# Patient Record
Sex: Male | Born: 1985 | Race: Black or African American | Hispanic: No | Marital: Single | Smoking: Current every day smoker
Health system: Southern US, Community
[De-identification: ages and names within clinical notes are randomized; demographics above are authoritative.]

## PROBLEM LIST (undated history)

## (undated) DIAGNOSIS — F329 Major depressive disorder, single episode, unspecified: Secondary | ICD-10-CM

## (undated) DIAGNOSIS — F32A Depression, unspecified: Secondary | ICD-10-CM

## (undated) DIAGNOSIS — F319 Bipolar disorder, unspecified: Secondary | ICD-10-CM

## (undated) DIAGNOSIS — F39 Unspecified mood [affective] disorder: Secondary | ICD-10-CM

## (undated) DIAGNOSIS — M1990 Unspecified osteoarthritis, unspecified site: Secondary | ICD-10-CM

## (undated) DIAGNOSIS — K589 Irritable bowel syndrome without diarrhea: Secondary | ICD-10-CM

## (undated) DIAGNOSIS — F419 Anxiety disorder, unspecified: Secondary | ICD-10-CM

## (undated) DIAGNOSIS — K5090 Crohn's disease, unspecified, without complications: Secondary | ICD-10-CM

## (undated) DIAGNOSIS — N200 Calculus of kidney: Secondary | ICD-10-CM

## (undated) DIAGNOSIS — F1910 Other psychoactive substance abuse, uncomplicated: Secondary | ICD-10-CM

## (undated) DIAGNOSIS — B009 Herpesviral infection, unspecified: Secondary | ICD-10-CM

## (undated) DIAGNOSIS — N451 Epididymitis: Secondary | ICD-10-CM

---

## 2006-05-31 DIAGNOSIS — R4589 Other symptoms and signs involving emotional state: Secondary | ICD-10-CM | POA: Insufficient documentation

## 2011-08-04 DIAGNOSIS — K589 Irritable bowel syndrome without diarrhea: Secondary | ICD-10-CM | POA: Insufficient documentation

## 2012-06-04 DIAGNOSIS — A6000 Herpesviral infection of urogenital system, unspecified: Secondary | ICD-10-CM | POA: Insufficient documentation

## 2012-08-03 DIAGNOSIS — N451 Epididymitis: Secondary | ICD-10-CM | POA: Insufficient documentation

## 2012-08-03 DIAGNOSIS — E559 Vitamin D deficiency, unspecified: Secondary | ICD-10-CM | POA: Insufficient documentation

## 2012-12-20 DIAGNOSIS — F411 Generalized anxiety disorder: Secondary | ICD-10-CM | POA: Insufficient documentation

## 2012-12-20 DIAGNOSIS — F319 Bipolar disorder, unspecified: Secondary | ICD-10-CM | POA: Insufficient documentation

## 2020-09-01 ENCOUNTER — Emergency Department
Admission: EM | Admit: 2020-09-01 | Discharge: 2020-09-01 | Disposition: A | Discharge status: Home / Self Care | Payer: Self-pay | Attending: Emergency Medicine | Admitting: Emergency Medicine

## 2020-09-01 ENCOUNTER — Emergency Department: Payer: Self-pay

## 2020-09-01 ENCOUNTER — Encounter: Payer: Self-pay | Admitting: Emergency Medicine

## 2020-09-01 ENCOUNTER — Other Ambulatory Visit: Payer: Self-pay

## 2020-09-01 DIAGNOSIS — F17210 Nicotine dependence, cigarettes, uncomplicated: Secondary | ICD-10-CM | POA: Insufficient documentation

## 2020-09-01 DIAGNOSIS — K529 Noninfective gastroenteritis and colitis, unspecified: Secondary | ICD-10-CM

## 2020-09-01 DIAGNOSIS — K5010 Crohn's disease of large intestine without complications: Secondary | ICD-10-CM

## 2020-09-01 DIAGNOSIS — R1030 Lower abdominal pain, unspecified: Secondary | ICD-10-CM | POA: Insufficient documentation

## 2020-09-01 HISTORY — DX: Unspecified osteoarthritis, unspecified site: M19.90

## 2020-09-01 HISTORY — DX: Crohn's disease, unspecified, without complications: K50.90

## 2020-09-01 HISTORY — DX: Herpesviral infection, unspecified: B00.9

## 2020-09-01 LAB — URINALYSIS, COMPLETE (UACMP) WITH MICROSCOPIC
Bilirubin Urine: NEGATIVE
Glucose, UA: NEGATIVE mg/dL
Hgb urine dipstick: NEGATIVE
Ketones, ur: NEGATIVE mg/dL
Leukocytes,Ua: NEGATIVE
Nitrite: NEGATIVE
Protein, ur: 30 mg/dL — AB
Specific Gravity, Urine: 1.005 (ref 1.005–1.030)
pH: 7 (ref 5.0–8.0)

## 2020-09-01 LAB — COMPREHENSIVE METABOLIC PANEL
ALT: 11 U/L (ref 0–44)
AST: 18 U/L (ref 15–41)
Albumin: 4.9 g/dL (ref 3.5–5.0)
Alkaline Phosphatase: 57 U/L (ref 38–126)
Anion gap: 8 (ref 5–15)
BUN: 6 mg/dL (ref 6–20)
CO2: 28 mmol/L (ref 22–32)
Calcium: 9.6 mg/dL (ref 8.9–10.3)
Chloride: 102 mmol/L (ref 98–111)
Creatinine, Ser: 0.79 mg/dL (ref 0.61–1.24)
GFR, Estimated: >60 mL/min (ref >60)
Glucose, Bld: 126 mg/dL — ABNORMAL HIGH (ref 70–99)
Potassium: 4.3 mmol/L (ref 3.5–5.1)
Sodium: 138 mmol/L (ref 135–145)
Total Bilirubin: 1.4 mg/dL — ABNORMAL HIGH (ref 0.3–1.2)
Total Protein: 8 g/dL (ref 6.5–8.1)

## 2020-09-01 LAB — CBC
Hemoglobin: 16.2 g/dL (ref 13.0–17.0)
Platelets: 139 10*3/uL — ABNORMAL LOW (ref 150–400)
WBC: 5.5 10*3/uL (ref 4.0–10.5)

## 2020-09-01 LAB — LIPASE, BLOOD: Lipase: 33 U/L (ref 11–51)

## 2020-09-01 MED ORDER — DICYCLOMINE HCL 10 MG PO CAPS: 10.0000 mg | ORAL_CAPSULE | Freq: Three times a day (TID) | ORAL | 0 refills | Status: DC | PRN

## 2020-09-01 MED ORDER — FOSFOMYCIN TROMETHAMINE 3 G PO PACK
3.0000 g | PACK | Freq: Once | ORAL | Status: AC
  Administered 2020-09-01: 3 g via ORAL
  Filled 2020-09-01: qty 3

## 2020-09-01 MED ORDER — METRONIDAZOLE 500 MG PO TABS: 500.0000 mg | ORAL_TABLET | Freq: Three times a day (TID) | ORAL | 0 refills | Status: AC

## 2020-09-01 MED ORDER — ONDANSETRON 4 MG PO TBDP: 4.0000 mg | ORAL_TABLET | Freq: Three times a day (TID) | ORAL | 0 refills | Status: DC | PRN

## 2020-09-01 MED ORDER — CIPROFLOXACIN HCL 500 MG PO TABS: 500.0000 mg | ORAL_TABLET | Freq: Two times a day (BID) | ORAL | 0 refills | Status: AC

## 2020-09-01 MED ORDER — SODIUM CHLORIDE 0.9 % IV BOLUS
1000.0000 mL | Freq: Once | INTRAVENOUS | Status: AC
  Administered 2020-09-01: 1000 mL via INTRAVENOUS

## 2020-09-01 MED ORDER — MORPHINE SULFATE (PF) 4 MG/ML IV SOLN
4.0000 mg | Freq: Once | INTRAVENOUS | Status: AC
  Administered 2020-09-01: 4 mg via INTRAVENOUS
  Filled 2020-09-01: qty 1

## 2020-09-01 MED ORDER — ONDANSETRON HCL 4 MG/2ML IJ SOLN
4.0000 mg | Freq: Once | INTRAMUSCULAR | Status: AC
  Administered 2020-09-01: 4 mg via INTRAVENOUS
  Filled 2020-09-01: qty 2

## 2020-09-01 MED ORDER — IOHEXOL 300 MG/ML  SOLN
100.0000 mL | Freq: Once | INTRAMUSCULAR | Status: AC | PRN
  Administered 2020-09-01: 100 mL via INTRAVENOUS

## 2020-09-01 NOTE — ED Notes (Signed)
E-signature not working at this time. Pt verbalized understanding of D/C instructions, prescriptions and follow up care with no further questions at this time. Pt in NAD and ambulatory at time of D/C.  

## 2020-09-01 NOTE — ED Notes (Signed)
Pt to CT

## 2020-09-01 NOTE — ED Notes (Signed)
Pt aware of needed repeat urine sample

## 2020-09-01 NOTE — ED Triage Notes (Signed)
Pt in w/lower abdominal pain x 53mo, hx of Crohn's. States he has been trying to manage w/just diet to this point, no meds. Reports bloody stools/diarrhea and weakness. Recently moved from [place], all records at [facility]

## 2020-09-01 NOTE — ED Provider Notes (Signed)
[facility] Emergency Department Provider Note  Time seen: 2:31 PM  I have reviewed the triage vital signs and the nursing notes.   HISTORY  Chief Complaint Abdominal Pain   HPI Sean Gallegos is a 34 y.o. male with a past medical history of Crohn's disease, presents emergency department for lower abdominal pain.  According to the patient for the past month or so he has been experiencing intermittent bloody stools as well as lower abdominal pain.  Patient states he was diagnosed with Crohn's disease approximately 1 year ago and his care has been at [facility] in [place], but the patient recently moved to this area.  Has not yet established care with a GI physician.  Patient has no known fevers, cough congestion shortness of breath or chest pain.  Describes his abdominal pain as moderate aching dull pain that has been constant for the past 1 month.   Past Medical History:  Diagnosis Date  . Arthritis   . Crohn disease (HCC)   . Herpes simplex type 2 infection     There are no problems to display for this patient.   History reviewed. No pertinent surgical history.  Prior to Admission medications   Not on File    Not on File  No family history on file.  Social History Social History   Tobacco Use  . Smoking status: Current Every Day Smoker    Packs/day: 1.00    Types: Cigarettes  . Smokeless tobacco: Never Used  Substance Use Topics  . Alcohol use: Not Currently  . Drug use: Not Currently    Types: Marijuana    Review of Systems Constitutional: Negative for fever Cardiovascular: Negative for chest pain. Respiratory: Negative for shortness of breath. Gastrointestinal: Intermittent lower abdominal pain.  Intermittent bloody stools.  States occasional nausea vomiting. Genitourinary: Negative for urinary compaints Musculoskeletal: Negative for musculoskeletal complaints Skin: Negative for skin complaints  Neurological: Negative for  headache All other ROS negative  ____________________________________________   PHYSICAL EXAM:  VITAL SIGNS: ED Triage Vitals  Enc Vitals Group     BP 09/01/20 1156 133/84     Pulse Rate 09/01/20 1156 78     Resp 09/01/20 1156 18     Temp 09/01/20 1156 98.1 F (36.7 C)     Temp Source 09/01/20 1156 Oral     SpO2 09/01/20 1156 99 %     Weight 09/01/20 1157 130 lb (59 kg)     Height --      Head Circumference --      Peak Flow --      Pain Score 09/01/20 1156 10     Pain Loc --      Pain Edu? --      Excl. in GC? --     Constitutional: Alert and oriented. Well appearing and in no distress. Eyes: Normal exam ENT      Head: Normocephalic and atraumatic.      Mouth/Throat: Mucous membranes are moist. Cardiovascular: Normal rate, regular rhythm.  Respiratory: Normal respiratory effort without tachypnea nor retractions. Breath sounds are clear  Gastrointestinal: Soft, mild diffuse tenderness palpation.  No rebound guarding or distention. Musculoskeletal: Nontender with normal range of motion in all extremities.  Neurologic:  Normal speech and language. No gross focal neurologic deficits Skin:  Skin is warm, dry and intact.  Psychiatric: Mood and affect are normal.   ____________________________________________   RADIOLOGY  CT pending  ____________________________________________   INITIAL IMPRESSION / ASSESSMENT AND PLAN /  ED COURSE  Pertinent labs & imaging results that were available during my care of the patient were reviewed by me and considered in my medical decision making (see chart for details).   Patient presents to the emergency department for lower abdominal discomfort intermittent bloody stools over the past 1 month along with intermittent nausea vomiting.  Patient states a history of Crohn's disease, states this feels like his typical Crohn's flare.  Patient has not yet established GI care in the area.  Patient's lab work does show white cells within his  urinalysis I have added on a GC chlamydia we will dose a one-time dose of fosfomycin in the emergency department and send a urine culture, this could also be reactive if the patient is experiencing colonic inflammation.  Remainder of the work-up is nonrevealing.  We will proceed with CT imaging of the abdomen/pelvis to further evaluate.  We will treat pain nausea and IV hydrate while awaiting CT results.  Patient agreeable to plan of care.  Patient care signed out to oncoming physician CT pending.  Sean Gallegos was evaluated in Emergency Department on 09/01/2020 for the symptoms described in the history of present illness. He was evaluated in the context of the global COVID-19 pandemic, which necessitated consideration that the patient might be at risk for infection with the SARS-CoV-2 virus that causes COVID-19. Institutional protocols and algorithms that pertain to the evaluation of patients at risk for COVID-19 are in a state of rapid change based on information released by regulatory bodies including the CDC and federal and state organizations. These policies and algorithms were followed during the patient's care in the ED.  ____________________________________________   FINAL CLINICAL IMPRESSION(S) / ED DIAGNOSES  Abdominal pain   Minna Antis, MD 09/01/20 1433

## 2020-09-01 NOTE — ED Provider Notes (Signed)
Patient is a 34 yo M with h/o Crohn's (diagnosed in [place], does not have GI physician here yet) here with abd pain. Labs show normal WBC, Hgb. CMP unremarkable, lipase normal. UA with mild pyuria, hematuria likely reactive 2/2 colitis based on CT A/P. No complications. Will place referral for GI, d/c with ABX, outpt follow-up.   Shaune Pollack, MD 09/01/20 [phone]

## 2020-09-03 LAB — URINE CULTURE: Culture: NO GROWTH

## 2020-09-08 ENCOUNTER — Other Ambulatory Visit: Payer: Self-pay

## 2020-09-09 ENCOUNTER — Other Ambulatory Visit: Payer: Self-pay

## 2020-09-09 ENCOUNTER — Encounter: Payer: Self-pay | Admitting: Gastroenterology

## 2020-09-09 ENCOUNTER — Ambulatory Visit (INDEPENDENT_AMBULATORY_CARE_PROVIDER_SITE_OTHER): Payer: Self-pay | Admitting: Gastroenterology

## 2020-09-09 VITALS — BP 105/72 | HR 78 | Temp 98.3°F | Ht 73.0 in | Wt 130.1 lb

## 2020-09-09 DIAGNOSIS — K50111 Crohn's disease of large intestine with rectal bleeding: Secondary | ICD-10-CM

## 2020-09-09 DIAGNOSIS — K51511 Left sided colitis with rectal bleeding: Secondary | ICD-10-CM

## 2020-09-09 DIAGNOSIS — K50819 Crohn's disease of both small and large intestine with unspecified complications: Secondary | ICD-10-CM

## 2020-09-09 MED ORDER — PREDNISONE 10 MG PO TABS: 40.0000 mg | ORAL_TABLET | Freq: Every day | ORAL | 0 refills | Status: DC

## 2020-09-09 NOTE — Progress Notes (Signed)
Arlyss Repress, MD [address]  [address]  [place], [place] [address]  Main: [phone]  Fax: [phone]    Gastroenterology Consultation  Referring Provider:     Shaune Pollack, MD Primary Care Physician:  Patient, No Pcp Per Primary Gastroenterologist:  Dr. Arlyss Repress Reason for Consultation:     History of Crohn's        HPI:   [place] Nordquist is a 34 y.o. male referred by Dr. Patient, No Pcp Per  for consultation & management of history of Crohn's.  Patient reports that he has been diagnosed with Crohn's disease at [facility], [place].  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  He reports that he has been having GI symptoms for several years.  He was recently at [facility] ER secondary to lower abdominal pain, fatigue, chills and diarrhea.  He also reports loss of appetite and weight loss.  Labs in the ER revealed mild thrombocytopenia, mild elevated T bilirubin, no leukocytosis, mildly elevated lipase.  He underwent CT abdomen pelvis with contrast which revealed mild thickening of the distal colon, rectosigmoid with increased vascularity.  He was discharged home on Cipro and Flagyl as well as Bentyl.  Patient continues to have ongoing symptoms, worsening, 4-5 watery bowel movements daily associated with ongoing lower abdominal pain  Patient does not have insurance and would like to apply for patient assistance program He does smoke more than 1 pack/day, also smokes marijuana  NSAIDs: None  Antiplts/Anticoagulants/Anti thrombotics: None  GI Procedures: None  Past Medical History:  Diagnosis Date  . Arthritis   . Crohn disease (HCC)   . Herpes simplex type 2 infection     History reviewed. No pertinent surgical history.  Current Outpatient Medications:  .  acyclovir (ZOVIRAX) 800 MG tablet, Take by mouth., Disp: , Rfl:  .  dicyclomine (BENTYL) 10 MG capsule, Take 1 capsule (10 mg total) by mouth 3  (three) times daily as needed for up to 7 days for spasms (abdominal pain/cramping)., Disp: 21 capsule, Rfl: 0 .  ondansetron (ZOFRAN ODT) 4 MG disintegrating tablet, Take 1 tablet (4 mg total) by mouth every 8 (eight) hours as needed for nausea or vomiting., Disp: 12 tablet, Rfl: 0 .  pantoprazole (PROTONIX) 40 MG tablet, Take 1 tablet by mouth daily., Disp: , Rfl:  .  PARoxetine (PAXIL) 20 MG tablet, Take by mouth., Disp: , Rfl:  .  predniSONE (DELTASONE) 10 MG tablet, Take 4 tablets (40 mg total) by mouth daily with breakfast for 14 days., Disp: 56 tablet, Rfl: 0   History reviewed. No pertinent family history.   Social History   Tobacco Use  . Smoking status: Current Every Day Smoker    Packs/day: 1.00    Types: Cigarettes  . Smokeless tobacco: Never Used  Substance Use Topics  . Alcohol use: Not Currently  . Drug use: Not Currently    Types: Marijuana    Allergies as of 09/09/2020 - Review Complete 09/09/2020  Allergen Reaction Noted  . Gluten meal Other (See Comments) 08/30/2012  . Other Itching 07/01/2016    Review of Systems:    All systems reviewed and negative except where noted in HPI.   Physical Exam:  BP 105/72 (BP Location: Left Arm, Patient Position: Sitting, Cuff Size: Normal)   Pulse 78   Temp 98.3 F (36.8 C) (Oral)   Ht 6\' 1"  (1.854 m)   Wt 130 lb 2  oz (59 kg)   BMI 17.17 kg/m  No LMP for male patient.  General:   Alert, thin built, poorly nourished, pleasant and cooperative in NAD Head:  Normocephalic and atraumatic. Eyes:  Sclera clear, no icterus.   Conjunctiva pink. Ears:  Normal auditory acuity. Nose:  No deformity, discharge, or lesions. Mouth:  No deformity or lesions,oropharynx pink & moist. Neck:  Supple; no masses or thyromegaly. Lungs:  Respirations even and unlabored.  Clear throughout to auscultation.   No wheezes, crackles, or rhonchi. No acute distress. Heart:  Regular rate and rhythm; no murmurs, clicks, rubs, or gallops. Abdomen:   Normal bowel sounds. Soft, lower abdominal tenderness and non-distended without masses, hepatosplenomegaly or hernias noted.  No guarding or rebound tenderness.   Rectal: Not performed Msk:  Symmetrical without gross deformities. Good, equal movement & strength bilaterally. Pulses:  Normal pulses noted. Extremities:  No clubbing or edema.  No cyanosis. Neurologic:  Alert and oriented x3;  grossly normal neurologically. Skin:  Intact without significant lesions or rashes. No jaundice. Lymph Nodes:  No significant cervical adenopathy. Psych:  Alert and cooperative. Normal mood and affect.  Imaging Studies: Reviewed  Assessment and Plan:   Ocie Tino is a 34 y.o. African-American male with history of Crohn's disease diagnosed in [place] clinic, chronic tobacco use is seen in consultation for progressively worsening lower abdominal pain associated with diarrhea, weight loss, fatigue.  Recent CT scan revealed left-sided colitis  Recommend stool studies to rule out infection Check fecal calprotectin levels Recommend colonoscopy with TI evaluation if stool studies are negative, to evaluate for Crohn's disease Labs today Trial of short course of prednisone 40 mg daily for 2 weeks given recent flareup of symptoms and underlying history of Crohn's disease  Follow up in 4 weeks   Arlyss Repress, MD

## 2020-09-09 NOTE — Patient Instructions (Signed)
Please find a Primary care physician before your next appointment with our office. [facility] Customer service manager can help you find one by calling [phone]

## 2020-09-14 ENCOUNTER — Other Ambulatory Visit: Payer: Self-pay

## 2020-09-14 ENCOUNTER — Encounter: Payer: Self-pay | Admitting: *Deleted

## 2020-09-14 DIAGNOSIS — Z79899 Other long term (current) drug therapy: Secondary | ICD-10-CM | POA: Insufficient documentation

## 2020-09-14 DIAGNOSIS — F17210 Nicotine dependence, cigarettes, uncomplicated: Secondary | ICD-10-CM | POA: Insufficient documentation

## 2020-09-14 DIAGNOSIS — R109 Unspecified abdominal pain: Secondary | ICD-10-CM | POA: Insufficient documentation

## 2020-09-14 DIAGNOSIS — F32A Depression, unspecified: Secondary | ICD-10-CM | POA: Insufficient documentation

## 2020-09-14 DIAGNOSIS — Z5900 Homelessness unspecified: Secondary | ICD-10-CM | POA: Insufficient documentation

## 2020-09-14 LAB — COMPREHENSIVE METABOLIC PANEL
ALT: 14 U/L (ref 0–44)
AST: 22 U/L (ref 15–41)
Albumin: 4.5 g/dL (ref 3.5–5.0)
Alkaline Phosphatase: 52 U/L (ref 38–126)
Anion gap: 10 (ref 5–15)
BUN: 7 mg/dL (ref 6–20)
CO2: 28 mmol/L (ref 22–32)
Calcium: 9.2 mg/dL (ref 8.9–10.3)
Chloride: 99 mmol/L (ref 98–111)
Creatinine, Ser: 0.92 mg/dL (ref 0.61–1.24)
GFR, Estimated: >60 mL/min (ref >60)
Glucose, Bld: 157 mg/dL — ABNORMAL HIGH (ref 70–99)
Potassium: 3.5 mmol/L (ref 3.5–5.1)
Sodium: 137 mmol/L (ref 135–145)
Total Bilirubin: 0.9 mg/dL (ref 0.3–1.2)
Total Protein: 7.4 g/dL (ref 6.5–8.1)

## 2020-09-14 LAB — LIPASE, BLOOD: Lipase: 33 U/L (ref 11–51)

## 2020-09-14 NOTE — ED Triage Notes (Signed)
First Nurse: patient states that he was seen here about a week ago for Chron's. Patient states that he has finished taking his medication for his Chron's and that he is continuing to have the same symptoms.

## 2020-09-14 NOTE — ED Triage Notes (Signed)
Pt reporting he feels as though his symptoms from a week ago have been getting wore. Pt reports continues abd pain, bilateral flank pain that is "tender" upon palpation, dysuria and testicular pain. All symptoms reportedly started over a week ago but have not gotten better after taking prescribed medications. Nausea without vomiting or diarrhea. No fevers.

## 2020-09-15 ENCOUNTER — Emergency Department
Admission: EM | Admit: 2020-09-15 | Discharge: 2020-09-15 | Disposition: A | Discharge status: Home / Self Care | Payer: Self-pay | Attending: Emergency Medicine | Admitting: Emergency Medicine

## 2020-09-15 DIAGNOSIS — G8929 Other chronic pain: Secondary | ICD-10-CM

## 2020-09-15 DIAGNOSIS — R45851 Suicidal ideations: Secondary | ICD-10-CM

## 2020-09-15 DIAGNOSIS — Z5900 Homelessness unspecified: Secondary | ICD-10-CM

## 2020-09-15 DIAGNOSIS — F32A Depression, unspecified: Secondary | ICD-10-CM

## 2020-09-15 DIAGNOSIS — K50111 Crohn's disease of large intestine with rectal bleeding: Secondary | ICD-10-CM

## 2020-09-15 LAB — URINALYSIS, COMPLETE (UACMP) WITH MICROSCOPIC
Bacteria, UA: NONE SEEN
Bilirubin Urine: NEGATIVE
Glucose, UA: NEGATIVE mg/dL
Hgb urine dipstick: NEGATIVE
Ketones, ur: NEGATIVE mg/dL
Leukocytes,Ua: NEGATIVE
Nitrite: NEGATIVE
Protein, ur: NEGATIVE mg/dL
Specific Gravity, Urine: 1.009 (ref 1.005–1.030)
Squamous Epithelial / HPF: NONE SEEN (ref 0–5)
pH: 6 (ref 5.0–8.0)

## 2020-09-15 LAB — CBC
Hemoglobin: 15.8 g/dL (ref 13.0–17.0)
Platelets: 161 10*3/uL (ref 150–400)
WBC: 6 10*3/uL (ref 4.0–10.5)
nRBC: 0 % (ref 0.0–0.2)

## 2020-09-15 MED ORDER — PREDNISONE 10 MG PO TABS: 40.0000 mg | ORAL_TABLET | Freq: Every day | ORAL | 0 refills | Status: AC

## 2020-09-15 MED ORDER — IBUPROFEN 600 MG PO TABS: 600.0000 mg | ORAL_TABLET | Freq: Four times a day (QID) | ORAL | Status: DC | PRN

## 2020-09-15 MED ORDER — OXYCODONE HCL 5 MG PO TABS
5.0000 mg | ORAL_TABLET | Freq: Once | ORAL | Status: AC
  Administered 2020-09-15: 5 mg via ORAL
  Filled 2020-09-15: qty 1

## 2020-09-15 MED ORDER — ACETAMINOPHEN 500 MG PO TABS
1000.0000 mg | ORAL_TABLET | Freq: Once | ORAL | Status: AC
  Administered 2020-09-15: 1000 mg via ORAL
  Filled 2020-09-15: qty 2

## 2020-09-15 MED ORDER — PREDNISONE 20 MG PO TABS: 40.0000 mg | ORAL_TABLET | Freq: Every day | ORAL | Status: DC

## 2020-09-15 MED ORDER — KETOROLAC TROMETHAMINE 30 MG/ML IJ SOLN
15.0000 mg | Freq: Once | INTRAMUSCULAR | Status: AC
  Administered 2020-09-15: 15 mg via INTRAVENOUS
  Filled 2020-09-15: qty 1

## 2020-09-15 MED ORDER — ONDANSETRON HCL 4 MG/2ML IJ SOLN
4.0000 mg | Freq: Once | INTRAMUSCULAR | Status: AC
  Administered 2020-09-15: 4 mg via INTRAVENOUS
  Filled 2020-09-15: qty 2

## 2020-09-15 MED ORDER — METHYLPREDNISOLONE SODIUM SUCC 125 MG IJ SOLR
125.0000 mg | Freq: Once | INTRAMUSCULAR | Status: AC
  Administered 2020-09-15: 125 mg via INTRAVENOUS
  Filled 2020-09-15: qty 2

## 2020-09-15 NOTE — ED Provider Notes (Signed)
Patient cleared by psychiatry after what sounds like an initial misunderstanding/confusion re: his suicidality. He denies any SI, HI, AVH. He is calm and cooperative. He was waiting for SW to assist with meds but is now requesting to leave. Given that he does not meet IVC criteria and I see no evidence of other acute emergent condition, will d/c. He was provided with a paper rx to take to med management.    Shaune Pollack, MD 09/15/20 [phone]

## 2020-09-15 NOTE — ED Provider Notes (Addendum)
[facility] Emergency Department Provider Note  ____________________________________________  Time seen: Approximately 1:03 AM  I have reviewed the triage vital signs and the nursing notes.   HISTORY  Chief Complaint Abdominal Pain and Testicle Pain   HPI Sean Gallegos is a 34 y.o. male who presents for evaluation of abdominal pain.  Patient reports that he has been dealing with chronic abdominal pain for over 20 years.  He reports that he received a diagnosis of Crohn's disease at the [facility] without any colonoscopy done.  He says that the doctors over there told him that he may have Crohn's or ulcerative colitis.  Unfortunately patient was unable to have access to definitive diagnosis or treatment.  He reports over the last 3 weeks he has been having pretty diffuse severe abdominal pain which is located in the lower part of his abdomen radiating down to his testicles and to bilateral flank.  Has been having 6-10 episodes of diarrhea which he noticed to be black in color and have mucus at times.  Has had pretty profound nausea and decreased appetite.  No vomiting.  No fevers but has had chills.  Also reports urinary frequency.  Denies any testicular swelling or penile discharge.  Patient was seen by GI 2 days ago to establish care and was recommended to be started on prednisone.  Unfortunately patient has been unable to afford.  Patient is also complaining of feeling extremely depressed.  He reports that he does not have any support system.  Lost all his money into the stock market and has been living in his car while dealing with chronic abdominal pain and diarrhea.  Patient reports having no family here, no job, being homeless.  He reports 1 prior episode of suicide attempt many years ago with overdose on narcotics.  At this time he feels suicidal but denies an active plan.  No access to guns.   Past Medical History:  Diagnosis Date  . Arthritis   . Crohn  disease (HCC)   . Herpes simplex type 2 infection     Patient Active Problem List   Diagnosis Date Noted  . Depression with suicidal ideation 09/15/2020  . Anxiety, generalized 12/20/2012  . Bipolar affective disorder (HCC) 12/20/2012  . Epididymitis 08/03/2012  . Vitamin D deficiency 08/03/2012  . Genital herpes simplex 06/04/2012  . Irritable bowel syndrome 08/04/2011  . Predominant disturbance of emotions 05/31/2006    History reviewed. No pertinent surgical history.  Prior to Admission medications   Medication Sig Start Date End Date Taking? Authorizing Provider  acyclovir (ZOVIRAX) 800 MG tablet Take by mouth.    [provider]  dicyclomine (BENTYL) 10 MG capsule Take 1 capsule (10 mg total) by mouth 3 (three) times daily as needed for up to 7 days for spasms (abdominal pain/cramping). 09/01/20 09/08/20  Shaune Pollack, MD  ondansetron (ZOFRAN ODT) 4 MG disintegrating tablet Take 1 tablet (4 mg total) by mouth every 8 (eight) hours as needed for nausea or vomiting. 09/01/20   Shaune Pollack, MD  pantoprazole (PROTONIX) 40 MG tablet Take 1 tablet by mouth daily. 11/05/19   [provider]  PARoxetine (PAXIL) 20 MG tablet Take by mouth. 08/15/15   [provider]  predniSONE (DELTASONE) 10 MG tablet Take 4 tablets (40 mg total) by mouth daily with breakfast for 14 days. 09/09/20 09/23/20  Toney Reil, MD    Allergies Gluten meal and Other  History reviewed. No pertinent family history.  Social History  Social History   Tobacco Use  . Smoking status: Current Every Day Smoker    Packs/day: 1.00    Types: Cigarettes  . Smokeless tobacco: Never Used  Substance Use Topics  . Alcohol use: Not Currently  . Drug use: Not Currently    Types: Marijuana    Review of Systems  Constitutional: Negative for fever. Eyes: Negative for visual changes. ENT: Negative for sore throat. Neck: No neck pain  Cardiovascular: Negative for chest  pain. Respiratory: Negative for shortness of breath. Gastrointestinal: + lower abdominal pain, diarrhea, nausea. No vomiting. Genitourinary: Negative for dysuria. Musculoskeletal: Negative for back pain. Skin: Negative for rash. Neurological: Negative for headaches, weakness or numbness. Psych: + depression and SI. No HI  ____________________________________________   PHYSICAL EXAM:  VITAL SIGNS: ED Triage Vitals  Enc Vitals Group     BP 09/14/20 2319 109/70     Pulse Rate 09/14/20 2319 76     Resp 09/14/20 2319 16     Temp 09/14/20 2319 98.5 F (36.9 C)     Temp Source 09/14/20 2319 Oral     SpO2 09/14/20 2319 100 %     Weight 09/14/20 2320 130 lb 1.1 oz (59 kg)     Height 09/14/20 2320 6\' 2"  (1.88 m)     Head Circumference --      Peak Flow --      Pain Score 09/14/20 2320 10     Pain Loc --      Pain Edu? --      Excl. in GC? --     Constitutional: Alert and oriented. Well appearing. HEENT:      Head: Normocephalic and atraumatic.         Eyes: Conjunctivae are normal. Sclera is non-icteric.       Mouth/Throat: Mucous membranes are moist.       Neck: Supple with no signs of meningismus. Cardiovascular: Regular rate and rhythm. No murmurs, gallops, or rubs. 2+ symmetrical distal pulses are present in all extremities. No JVD. Respiratory: Normal respiratory effort. Lungs are clear to auscultation bilaterally. No wheezes, crackles, or rhonchi.  Gastrointestinal: Soft, diffusely tender to palpation worse on the lower quadrants. No rebound or guarding. Genitourinary: No CVA tenderness. Bilateral testicles are descended with no tenderness to palpation, bilateral positive cremasteric reflexes are present, no swelling or erythema of the scrotum. No evidence of inguinal hernia.  Prostate exam is normal with no tenderness.  Rectal exam showing light brown stool guaiac negative. Musculoskeletal: No edema, cyanosis, or erythema of extremities. Neurologic: Normal speech and  language. Face is symmetric. Moving all extremities. No gross focal neurologic deficits are appreciated. Skin: Skin is warm, dry and intact. No rash noted. Psychiatric: Mood and affect are normal. Speech and behavior are normal.  ____________________________________________   LABS (all labs ordered are listed, but only abnormal results are displayed)  Labs Reviewed  COMPREHENSIVE METABOLIC PANEL - Abnormal; Notable for the following components:      Result Value   Glucose, Bld 157 (*)    All other components within normal limits  URINALYSIS, COMPLETE (UACMP) WITH MICROSCOPIC - Abnormal; Notable for the following components:   Color, Urine YELLOW (*)    APPearance CLEAR (*)    All other components within normal limits  LIPASE, BLOOD  CBC   ____________________________________________  EKG  ED ECG REPORT I, 13/07/21, the attending physician, personally viewed and interpreted this ECG.  Normal sinus rhythm, rate of 66, normal intervals, normal axis, no ST  elevations or depressions, RSR'.  No prior for comparison. ____________________________________________  RADIOLOGY  none  ____________________________________________   PROCEDURES  Procedure(s) performed: None Procedures Critical Care performed:  None ____________________________________________   INITIAL IMPRESSION / ASSESSMENT AND PLAN / ED COURSE  34 y.o. male who presents for evaluation of abdominal pain.  Patient with questionable diagnosis of inflammatory bowel disease.  I did an extensive review of his chart from [facility] clinic.  Patient's last colonoscopy was in 2007 and it was normal.  He also had a CT scan done at the [facility] clinic in 2020 which was also negative.  He was recently seen here about 2 weeks ago with a CT concerning for colitis.  He was placed on Cipro and he reports that he finished the whole course with no significant improvement in his pain or diarrhea.  He was also able to  establish care with GI and was seen by them 6 days ago.  At that point they recommended stool studies but patient has been unable to provide a stool sample yet.  I also recommended that he be started on prednisone.  Due to lack of insurance or financial ability patient has been unable to start the prednisone.  Currently his vitals are within normal limits, his GU exam is normal with no testicular tenderness, prostate exam also normal, rectal exam showing light brown stool guaiac negative. he does have diffuse tenderness in his lower abdomen.  Patient did undergo CT on his visit 2 weeks ago therefore I do not think he warrants a repeat imaging at this time.  His symptoms are the same and persistent most likely due to inflammatory bowel disease.  He is supposed to have a colonoscopy with GI and I explained to him that that is probably the most important test at this time to be able to diagnose him appropriately.  His blood work here is unremarkable with a normal white count, normal platelets, no evidence of anemia, normal electrolytes, mild hyperglycemia with no evidence of DKA, normal lipase and liver enzymes, urinalysis with no evidence of UTI.  Will initiate treatment with steroids here, will also give him a dose of IV Toradol, p.o. Tylenol and oxycodone, and IV Zofran for symptom relief.  In terms of his mental health, patient seems to be dealing with a lot, no support system, homeless living in his car, unable to afford his medications or care.  Does have a history of depression with prior suicide attempt.  He is having passive SI and asking for help with.  I will consult psychiatry for evaluation.     _________________________ 2:46 AM on 09/15/2020 -----------------------------------------  Patient feels markedly improved from his pain perspective.  At this time will consult social work and case manager to help patient with his social situation and to help him be able to obtain the prednisone that was  prescribed by the GI doctor.  Patient will stay here overnight to see the psychiatrist in the morning.  At this time is medically clear.  Patient is able to contract for safety and tells me that he will not kill himself but he really needs help to get back on his feet.   The patient has been placed in psychiatric observation due to the need to provide a safe environment for the patient while obtaining psychiatric consultation and evaluation, as well as ongoing medical and medication management to treat the patient's condition.  The patient has not been placed under full IVC at this time.  _________________________ 3:31  AM on 09/15/2020 -----------------------------------------  Patient seen by psychiatry NP who recommended inpatient admission.  At that time patient became very upset and does not wish to be admitted.  Reports prior admissions to mental health facility they were very unpleasant.  The psychiatry NP recommended putting patient under IVC. IVC documentation has been taken. Will consult telepsychiatry for further evaluation.  The patient has been placed in psychiatric observation due to the need to provide a safe environment for the patient while obtaining psychiatric consultation and evaluation, as well as ongoing medical and medication management to treat the patient's condition.  The patient has been placed under full IVC at this time.   _________________________ 5:17 AM on 09/15/2020 -----------------------------------------  Patient evaluated by Dr. Kathie DikeAlberto Penalver, telepsychiatrist who cleared patient for discharge. Patient continues to tell me that he does not want to kill himself, just wants help getting his medications so his symptoms and pain are under control and he can go on with his life. I have consulted SW to help in assistance with his meds. IVC lifted by psychiatrist.   _____________________________________________ Please note:  Patient was evaluated in Emergency  Department today for the symptoms described in the history of present illness. Patient was evaluated in the context of the global COVID-19 pandemic, which necessitated consideration that the patient might be at risk for infection with the SARS-CoV-2 virus that causes COVID-19. Institutional protocols and algorithms that pertain to the evaluation of patients at risk for COVID-19 are in a state of rapid change based on information released by regulatory bodies including the CDC and federal and state organizations. These policies and algorithms were followed during the patient's care in the ED.  Some ED evaluations and interventions may be delayed as a result of limited staffing during the pandemic.   [place] Controlled Substance Database was reviewed by me. ____________________________________________   FINAL CLINICAL IMPRESSION(S) / ED DIAGNOSES   Final diagnoses:  Chronic abdominal pain  Depression, unspecified depression type  Homelessness      NEW MEDICATIONS STARTED DURING THIS VISIT:  ED Discharge Orders    None       Note:  This document was prepared using Dragon voice recognition software and may include unintentional dictation errors.    Don PerkingVeronese, [place], MD 09/15/20 [id number]    Nita SickleVeronese, [employer], MD 09/15/20 [id number]    Nita SickleVeronese, [place], MD 09/15/20 [id number]    Nita SickleVeronese, [place], MD 09/15/20 [id number]    Nita SickleVeronese, [place], MD 09/15/20 [phone]

## 2020-09-15 NOTE — ED Notes (Signed)
Pt discharged home. Pt refused prescription and discharge papers. EDP made aware. Pt had all of his belongings at bedside.  Pt denies SI.

## 2020-09-15 NOTE — ED Notes (Signed)
Hourly rounding reveals patient in room. No complaints, stable, in no acute distress. Q15 minute rounds and monitoring via Rover and Officer to continue.   

## 2020-09-15 NOTE — Consult Note (Signed)
[facility] Face[facility] Consult   Reason for Consult:  Psychiatric Evaluation  Referring Physician:  Dr. Don PerkingVeronese  Patient Identification: Sean Gallegos MRN:  [id number] Principal Diagnosis: Depression with suicidal ideation Diagnosis:  Principal Problem:   Depression with suicidal ideation   Total Time spent with patient: 45 minutes  Subjective:   Sean Gallegos is a 34 y.o. male patient admitted with per, triage nurse, pt is having groin pain and pain in his lower back/abdomen. Pt states it's been going on for a while and isn't getting any better. Pt is having groin pain and pain in his lower back/abdomen. Pt states it's been going on for a while and isn't getting any better.   HPI:  Sean Gallegos, 34 y.o., male patient seen via tele psych by this provider, Dr. Don PerkingVeronese; and chart reviewed on 09/15/20.  On evaluation Sean Gallegos that he has been having pain in his abdomen and groin area and that is what brought him into the hospital tonight.  Patient also states that he has been depressed. He admits to attempting to kill himself 3 years ago while visiting a girlfriend in [place] two years ago.  He currently endorses suicidal ideations but with no plan.  He states that he was once diagnosed with bipolar disorder but then states that, that diagnosis was made in error and that the medication he was given for it was the wrong medication.  When asked how was he informed that the diagnosis was incorrect, patient begins to "shut down" and refuses to answer any questions after that. Patient responds at a later time, "just let me out, i'll be alright."  However, patient isn't able to contract for safety.  He says, "if I do I do and if I dont I dont, you dont need to worry about it."  During evaluation Sean Crumblyemario Strahle is alert/oriented x 4; affitated/ labile/depressed; and mood is congruent with affect.  He does not appear to be responding to internal/external stimuli or delusional thoughts.   Patient endorses suicidal  Ideation, denies HI, psychosis, and paranoia.   Past Psychiatric History: Bipolar disorder  Risk to Self:   Risk to Others:   Prior Inpatient Therapy:   Prior Outpatient Therapy:    Past Medical History:  Past Medical History:  Diagnosis Date  . Arthritis   . Crohn disease (HCC)   . Herpes simplex type 2 infection    History reviewed. No pertinent surgical history. Family History: History reviewed. No pertinent family history. Family Psychiatric  History: unkknown Social History:  Social History   Substance and Sexual Activity  Alcohol Use Not Currently     Social History   Substance and Sexual Activity  Drug Use Not Currently  . Types: Marijuana    Social History   Socioeconomic History  . Marital status: Single    Spouse name: Not on file  . Number of children: Not on file  . Years of education: Not on file  . Highest education level: Not on file  Occupational History  . Not on file  Tobacco Use  . Smoking status: Current Every Day Smoker    Packs/day: 1.00    Types: Cigarettes  . Smokeless tobacco: Never Used  Substance and Sexual Activity  . Alcohol use: Not Currently  . Drug use: Not Currently    Types: Marijuana  . Sexual activity: Not on file  Other Topics Concern  . Not on file  Social History Narrative  . Not on file   Social Determinants  of Health   Financial Resource Strain:   . Difficulty of Paying Living Expenses: Not on file  Food Insecurity:   . Worried About Programme researcher, broadcasting/film/video in the Last Year: Not on file  . Ran Out of Food in the Last Year: Not on file  Transportation Needs:   . Lack of Transportation (Medical): Not on file  . Lack of Transportation (Non-Medical): Not on file  Physical Activity:   . Days of Exercise per Week: Not on file  . Minutes of Exercise per Session: Not on file  Stress:   . Feeling of Stress : Not on file  Social Connections:   . Frequency of Communication with Friends and  Family: Not on file  . Frequency of Social Gatherings with Friends and Family: Not on file  . Attends Religious Services: Not on file  . Active Member of Clubs or Organizations: Not on file  . Attends Banker Meetings: Not on file  . Marital Status: Not on file   Additional Social History:    Allergies:   Allergies  Allergen Reactions  . Gluten Meal Other (See Comments)    Per patient  . Other Itching    Watery eyes, runny nose, sneezing    Labs:  Results for orders placed or performed during the hospital encounter of 09/15/20 (from the past 48 hour(s))  Lipase, blood     Status: None   Collection Time: 09/14/20 11:24 PM  Result Value Ref Range   Lipase 33 11 - 51 U/L    Comment: Performed at [facility], [address] [place], [place] [address]  Comprehensive metabolic panel     Status: Abnormal   Collection Time: 09/14/20 11:24 PM  Result Value Ref Range   Sodium 137 135 - 145 mmol/L   Potassium 3.5 3.5 - 5.1 mmol/L   Chloride 99 98 - 111 mmol/L   CO2 28 22 - 32 mmol/L   Glucose, Bld 157 (H) 70 - 99 mg/dL    Comment: Glucose reference range applies only to samples taken after fasting for at least 8 hours.   BUN 7 6 - 20 mg/dL   Creatinine, Ser 0.48 0.61 - 1.24 mg/dL   Calcium 9.2 8.9 - 88.9 mg/dL   Total Protein 7.4 6.5 - 8.1 g/dL   Albumin 4.5 3.5 - 5.0 g/dL   AST 22 15 - 41 U/L   ALT 14 0 - 44 U/L   Alkaline Phosphatase 52 38 - 126 U/L   Total Bilirubin 0.9 0.3 - 1.2 mg/dL   GFR, Estimated >16 >94 mL/min    Comment: (NOTE) Calculated using the CKD-EPI Creatinine Equation (2021)    Anion gap 10 5 - 15    Comment: Performed at [facility], [address] [place], [place] [address]  CBC     Status: None   Collection Time: 09/14/20 11:24 PM  Result Value Ref Range   WBC 6.0 4.0 - 10.5 K/uL   RBC RESULTS UNAVAILABLE DUE TO INTERFERING SUBSTANCE 4.22 - 5.81 MIL/uL   Hemoglobin 15.8 13.0 - 17.0 g/dL   HCT RESULTS UNAVAILABLE  DUE TO INTERFERING SUBSTANCE 39 - 52 %   MCV RESULTS UNAVAILABLE DUE TO INTERFERING SUBSTANCE 80.0 - 100.0 fL   MCH RESULTS UNAVAILABLE DUE TO INTERFERING SUBSTANCE 26.0 - 34.0 pg   MCHC RESULTS UNAVAILABLE DUE TO INTERFERING SUBSTANCE 30.0 - 36.0 g/dL   RDW RESULTS UNAVAILABLE DUE TO INTERFERING SUBSTANCE 11.5 - 15.5 %  Platelets 161 150 - 400 K/uL   nRBC 0.0 0.0 - 0.2 %    Comment: Performed at [facility], [address] [place], [place] [address]  Urinalysis, Complete w Microscopic     Status: Abnormal   Collection Time: 09/14/20 11:24 PM  Result Value Ref Range   Color, Urine YELLOW (A) YELLOW   APPearance CLEAR (A) CLEAR   Specific Gravity, Urine 1.009 1.005 - 1.030   pH 6.0 5.0 - 8.0   Glucose, UA NEGATIVE NEGATIVE mg/dL   Hgb urine dipstick NEGATIVE NEGATIVE   Bilirubin Urine NEGATIVE NEGATIVE   Ketones, ur NEGATIVE NEGATIVE mg/dL   Protein, ur NEGATIVE NEGATIVE mg/dL   Nitrite NEGATIVE NEGATIVE   Leukocytes,Ua NEGATIVE NEGATIVE   RBC / HPF 0-5 0 - 5 RBC/hpf   WBC, UA 0-5 0 - 5 WBC/hpf   Bacteria, UA NONE SEEN NONE SEEN   Squamous Epithelial / LPF NONE SEEN 0 - 5   Mucus PRESENT     Comment: Performed at [facility], [address], [place], [place] [address]    Current Facility-Administered Medications  Medication Dose Route Frequency Provider Last Rate Last Admin  . ibuprofen (ADVIL) tablet 600 mg  600 mg Oral Q6H PRN Nita Sickle, MD       Current Outpatient Medications  Medication Sig Dispense Refill  . acyclovir (ZOVIRAX) 800 MG tablet Take by mouth.    . dicyclomine (BENTYL) 10 MG capsule Take 1 capsule (10 mg total) by mouth 3 (three) times daily as needed for up to 7 days for spasms (abdominal pain/cramping). 21 capsule 0  . ondansetron (ZOFRAN ODT) 4 MG disintegrating tablet Take 1 tablet (4 mg total) by mouth every 8 (eight) hours as needed for nausea or vomiting. 12 tablet 0  . pantoprazole (PROTONIX) 40 MG tablet Take 1 tablet  by mouth daily.    Marland Kitchen PARoxetine (PAXIL) 20 MG tablet Take by mouth.    . predniSONE (DELTASONE) 10 MG tablet Take 4 tablets (40 mg total) by mouth daily with breakfast for 14 days. 56 tablet 0    Musculoskeletal: Strength & Muscle Tone: within normal limits Gait & Station: normal Patient leans: N/A  Psychiatric Specialty Exam: Physical Exam Vitals and nursing note reviewed.  Constitutional:      Appearance: He is well-developed.  HENT:     Head: Normocephalic and atraumatic.  Pulmonary:     Effort: Pulmonary effort is normal.  Skin:    General: Skin is dry.  Neurological:     General: No focal deficit present.     Mental Status: He is alert and oriented to person, place, and time.  Psychiatric:        Attention and Perception: Attention normal.        Mood and Affect: Mood is depressed. Affect is flat and angry.        Speech: Speech normal.        Behavior: Behavior is agitated and combative.        Thought Content: Thought content includes suicidal ideation.        Judgment: Judgment is impulsive and inappropriate.     Review of Systems  Psychiatric/Behavioral: Positive for agitation, dysphoric mood and suicidal ideas.  All other systems reviewed and are negative.   Blood pressure 126/80, pulse 69, temperature 98.5 F (36.9 C), temperature source Oral, resp. rate 16, height 6\' 2"  (1.88 m), weight 59 kg, SpO2 99 %.Body mass index is 16.7 kg/m.  General Appearance: Casual  Eye Contact:  Minimal  Speech:  Clear and Coherent  Volume:  Normal  Mood:  Depressed and Irritable  Affect:  Blunt  Thought Process:  Coherent and Descriptions of Associations: Intact  Orientation:  Full (Time, Place, and Person)  Thought Content:  Logical  Suicidal Thoughts:  Yes.  without intent/plan  Homicidal Thoughts:  No  Memory:  Remote;   Fair  Judgement:  Fair  Insight:  Lacking  Psychomotor Activity:  Normal  Concentration:  Attention Span: Fair  Recall:  Good  Fund of Knowledge:   Fair  Language:  Fair  Akathisia:  NA  Handed:  Left  AIMS (if indicated):     Assets:  Desire for Improvement  ADL's:  Intact  Cognition:  WNL  Sleep:      Disposition: Recommend psychiatric Inpatient admission when medically cleared. Supportive therapy provided about ongoing stressors. Discussed crisis plan, support from social network, calling 911, coming to the Emergency Department, and calling Suicide Hotline.  Jearld Lesch, NP 09/15/2020 2:58 AM

## 2020-09-15 NOTE — ED Notes (Signed)
Pt is having groin pain and pain in his lower back/abdomen. Pt states it's been going on for a while and isn't getting any better.

## 2020-09-15 NOTE — ED Notes (Signed)
Pt packed up his belongings and wanting to leave. EDP made aware.  Prescription and discharge paperwork printed for patient.

## 2020-09-15 NOTE — ED Notes (Signed)
Pt upset one of his diagnoses was homelessness.  Pt left without prescription or discharge papers. Pt did not want to wait for social work consult.    Refused VS and to sign for discharge.

## 2020-09-15 NOTE — ED Notes (Signed)
SOC called for consult 

## 2020-09-15 NOTE — BH Assessment (Signed)
Assessment Note  Sean Gallegos is an 34 y.o. male. Who presents to the ER voluntarily due to groin pain and pain in his lower back/abdomen. Pt states that this is chronic. He shares that he relocated from [place]. He admits to depression secondary to medical illness and homelessness. He reports 2 previous admissions in 2016 and 2017. Pt self reports a history of Depression, Anxiety and PTSD. He reports fleeting suicidal thoughts when in distress. Pt has contracted for safety and denied any suicidal plan or intent. Patient is alert/ oriented/clam/cooperative and denies SI/HI/Psychosis. He contracts for safety and agrees to f/u with OP provider     Diagnosis: Major Depressive Disorder   Past Medical History:  Past Medical History:  Diagnosis Date  . Arthritis   . Crohn disease (HCC)   . Herpes simplex type 2 infection     History reviewed. No pertinent surgical history.  Family History: History reviewed. No pertinent family history.  Social History:  reports that he has been smoking cigarettes. He has been smoking about 1.00 pack per day. He has never used smokeless tobacco. He reports previous alcohol use. He reports previous drug use. Drug: Marijuana.  Additional Social History:  Alcohol / Drug Use History of alcohol / drug use?: Yes Longest period of sobriety (when/how long): Fews days Substance #1 Name of Substance 1: THC 1 - Age of First Use: Teens 1 - Amount (size/oz): Unknown 1 - Frequency: daiy 1 - Duration: ongoing 1 - Last Use / Amount: few days  CIWA: CIWA-Ar BP: 126/80 Pulse Rate: 69 COWS:    Allergies:  Allergies  Allergen Reactions  . Gluten Meal Other (See Comments)    Per patient  . Other Itching    Watery eyes, runny nose, sneezing    Home Medications: (Not in a hospital admission)   OB/GYN Status:  No LMP for male patient.  General Assessment Data Location of Assessment: [facility] ED TTS Assessment: In system Is this a Tele or Face-to-Face Assessment?:  Tele Assessment Is this an Initial Assessment or a Re-assessment for this encounter?: Initial Assessment Language Other than English: No Living Arrangements: Homeless/Shelter What gender do you identify as?: Male Date Telepsych consult ordered in [facility]: 09/15/20 Time Telepsych consult ordered in [facility]: 0330 Marital status: Single Living Arrangements: Other (Comment) Can pt return to current living arrangement?: Yes Admission Status: Involuntary Petitioner: ED Attending Is patient capable of signing voluntary admission?: No Referral Source: Self/Family/Friend Insurance type: None   Medical Screening Exam [facility] Walk-in ONLY) Medical Exam completed: Yes  Crisis Care Plan Living Arrangements: Other (Comment) Name of Psychiatrist: none  Name of Therapist: none   Education Status Is patient currently in school?: No Is the patient employed, unemployed or receiving disability?: Unemployed  Risk to self with the past 6 months Suicidal Ideation: No Has patient been a risk to self within the past 6 months prior to admission? : No Suicidal Intent: No Has patient had any suicidal intent within the past 6 months prior to admission? : No Is patient at risk for suicide?: No Suicidal Plan?: No Has patient had any suicidal plan within the past 6 months prior to admission? : No Access to Means: No What has been your use of drugs/alcohol within the last 12 months?: THC Previous Attempts/Gestures: Yes How many times?: 1 Other Self Harm Risks: none  Triggers for Past Attempts: Other (Comment) (Pain ) Family Suicide History: No Recent stressful life event(s): Conflict (Comment), Loss (Comment), Financial Problems Persecutory voices/beliefs?: No Depression: Yes  Depression Symptoms: Isolating, Fatigue, Loss of interest in usual pleasures, Feeling worthless/self pity, Feeling angry/irritable Substance abuse history and/or treatment for substance abuse?: Yes Suicide prevention information given to  non-admitted patients: Not applicable  Risk to Others within the past 6 months Homicidal Ideation: No Does patient have any lifetime risk of violence toward others beyond the six months prior to admission? : No Thoughts of Harm to Others: No Current Homicidal Intent: No Current Homicidal Plan: No Access to Homicidal Means: No History of harm to others?: No Assessment of Violence: None Noted Does patient have access to weapons?: No Criminal Charges Pending?: No Does patient have a court date: No Is patient on probation?: No  Psychosis Hallucinations: None noted Delusions: None noted  Mental Status Report Appearance/Hygiene: In scrubs Eye Contact: Fair Motor Activity: Freedom of movement Speech: Logical/coherent Level of Consciousness: Alert Mood: Depressed Affect: Sad Anxiety Level: Minimal Thought Processes: Coherent Judgement: Unimpaired Orientation: Place, Time, Situation, Person Obsessive Compulsive Thoughts/Behaviors: None  Cognitive Functioning Concentration: Normal Memory: Recent Intact, Remote Intact Is patient IDD: No Insight: Good Impulse Control: Good Appetite: Poor Have you had any weight changes? : No Change Sleep: Decreased Total Hours of Sleep: 3 Vegetative Symptoms: None  ADLScreening [facility]) Patient's cognitive ability adequate to safely complete daily activities?: Yes Patient able to express need for assistance with ADLs?: Yes Independently performs ADLs?: Yes (appropriate for developmental age)  Prior Inpatient Therapy Prior Inpatient Therapy: Yes Prior Therapy Dates: 2017 Prior Therapy Facilty/Provider(s): Out of state Reason for Treatment: Depression, PTSd  Prior Outpatient Therapy Prior Outpatient Therapy: No Does patient have an ACCT team?: No Does patient have Intensive In-House Services?  : No Does patient have [facility] services? : No Does patient have P4CC services?: No  ADL Screening (condition at time of  admission) Patient's cognitive ability adequate to safely complete daily activities?: Yes Patient able to express need for assistance with ADLs?: Yes Independently performs ADLs?: Yes (appropriate for developmental age)       Abuse/Neglect Assessment (Assessment to be complete while patient is alone) Abuse/Neglect Assessment Can Be Completed: Yes Physical Abuse: Denies Verbal Abuse: Denies Sexual Abuse: Denies Exploitation of patient/patient's resources: Denies Self-Neglect: Denies Values / Beliefs Cultural Requests During Hospitalization: None Spiritual Requests During Hospitalization: None Consults Spiritual Care Consult Needed: No Transition of Care Team Consult Needed: No Advance Directives (For Healthcare) Does Patient Have a Medical Advance Directive?: No Would patient like information on creating a medical advance directive?: No - Patient declined          Disposition:  Disposition Initial Assessment Completed for this Encounter: Yes Patient referred to: Other (Comment) (Consult with [facility] )  On Site Evaluation by:   Reviewed with Physician:    Asa Saunas 09/15/2020 4:51 AM

## 2020-09-15 NOTE — ED Notes (Signed)
IVC/pending psych consult 

## 2020-09-15 NOTE — Discharge Instructions (Signed)
At this point it is very important they follow-up with your GI doctor for further evaluation and possible diagnosis of Crohn's disease. The GI doctor wants you to take 400 mg of prednisone a day for the next 2 weeks. I am providing you with another prescription. Take Tylenol or ibuprofen for your pain. Return to the emergency room for new or worsening pain, fever or chills, vomiting, or bloody/black diarrhea.

## 2020-09-15 NOTE — ED Notes (Signed)
Pt. Transferred to room 19H from room 18 . Patient screened for contraband. Patient refused to dress out and give up his belongings. Report to include Situation, Background, Assessment and Recommendations from [facility]. Patient is alert and oriented, warm and dry in no acute distress. Patient refused to answer SI, HI, and AVH. He said " it is all a mistake". Pt. Encouraged to let me know if needs arise.

## 2020-09-17 LAB — VITAMIN D 25 HYDROXY (VIT D DEFICIENCY, FRACTURES): Vit D, 25-Hydroxy: 16.1 ng/mL — ABNORMAL LOW (ref 30.0–100.0)

## 2020-09-17 LAB — HEPATITIS B SURFACE ANTIGEN: Hepatitis B Surface Ag: NEGATIVE

## 2020-09-17 LAB — B12 AND FOLATE PANEL
Folate: 7.3 ng/mL (ref >3.0)
Vitamin B-12: 348 pg/mL (ref 232–1245)

## 2020-09-17 LAB — IRON,TIBC AND FERRITIN PANEL
Ferritin: 186 ng/mL (ref 30–400)
Iron Saturation: 49 % (ref 15–55)
Iron: 128 ug/dL (ref 38–169)
Total Iron Binding Capacity: 261 ug/dL (ref 250–450)
UIBC: 133 ug/dL (ref 111–343)

## 2020-09-17 LAB — QUANTIFERON-TB GOLD PLUS
QuantiFERON Mitogen Value: >10 IU/mL
QuantiFERON Nil Value: 0 IU/mL
QuantiFERON TB1 Ag Value: 0.01 IU/mL
QuantiFERON TB2 Ag Value: 0 IU/mL
QuantiFERON-TB Gold Plus: NEGATIVE

## 2020-09-17 LAB — HEPATITIS B CORE ANTIBODY, TOTAL: Hep B Core Total Ab: NEGATIVE

## 2020-09-17 LAB — THIOPURINE METHYLTRANSFERASE (TPMT), RBC: TPMT Activity:: 26.2 Units/mL RBC

## 2020-09-17 LAB — HEPATITIS A ANTIBODY, TOTAL: hep A Total Ab: NEGATIVE

## 2020-09-17 LAB — C-REACTIVE PROTEIN: CRP: <1 mg/L (ref 0–10)

## 2020-09-17 LAB — HEPATITIS B SURFACE ANTIBODY,QUALITATIVE: Hep B Surface Ab, Qual: REACTIVE

## 2020-09-22 ENCOUNTER — Other Ambulatory Visit: Payer: Self-pay

## 2020-09-22 ENCOUNTER — Other Ambulatory Visit
Admission: RE | Admit: 2020-09-22 | Discharge: 2020-09-22 | Disposition: A | Discharge status: Home / Self Care | Payer: HRSA Program | Source: Ambulatory Visit | Attending: Gastroenterology | Admitting: Gastroenterology

## 2020-09-22 DIAGNOSIS — Z20822 Contact with and (suspected) exposure to covid-19: Secondary | ICD-10-CM | POA: Insufficient documentation

## 2020-09-22 DIAGNOSIS — Z01812 Encounter for preprocedural laboratory examination: Secondary | ICD-10-CM | POA: Diagnosis present

## 2020-09-23 ENCOUNTER — Encounter: Payer: Self-pay | Admitting: Gastroenterology

## 2020-09-23 LAB — SARS CORONAVIRUS 2 (TAT 6-24 HRS): SARS Coronavirus 2: NEGATIVE

## 2020-09-24 ENCOUNTER — Encounter
Admission: RE | Disposition: A | Discharge status: Home / Self Care | Payer: Self-pay | Source: Home / Self Care | Attending: Gastroenterology

## 2020-09-24 ENCOUNTER — Other Ambulatory Visit: Payer: Self-pay

## 2020-09-24 ENCOUNTER — Ambulatory Visit
Admission: RE | Admit: 2020-09-24 | Discharge: 2020-09-24 | Disposition: A | Discharge status: Home / Self Care | Payer: Self-pay | Attending: Gastroenterology | Admitting: Gastroenterology

## 2020-09-24 ENCOUNTER — Encounter: Payer: Self-pay | Admitting: Anesthesiology

## 2020-09-24 DIAGNOSIS — Z539 Procedure and treatment not carried out, unspecified reason: Secondary | ICD-10-CM | POA: Insufficient documentation

## 2020-09-24 DIAGNOSIS — K5080 Crohn's disease of both small and large intestine without complications: Secondary | ICD-10-CM | POA: Insufficient documentation

## 2020-09-24 DIAGNOSIS — K50819 Crohn's disease of both small and large intestine with unspecified complications: Secondary | ICD-10-CM

## 2020-09-24 HISTORY — DX: Calculus of kidney: N20.0

## 2020-09-24 HISTORY — DX: Major depressive disorder, single episode, unspecified: F32.9

## 2020-09-24 HISTORY — DX: Epididymitis: N45.1

## 2020-09-24 HISTORY — DX: Other psychoactive substance abuse, uncomplicated: F19.10

## 2020-09-24 HISTORY — DX: Anxiety disorder, unspecified: F41.9

## 2020-09-24 HISTORY — DX: Unspecified mood (affective) disorder: F39

## 2020-09-24 HISTORY — DX: Depression, unspecified: F32.A

## 2020-09-24 HISTORY — DX: Bipolar disorder, unspecified: F31.9

## 2020-09-24 HISTORY — DX: Irritable bowel syndrome, unspecified: K58.9

## 2020-09-24 SURGERY — COLONOSCOPY WITH PROPOFOL
Anesthesia: General

## 2020-09-24 MED ORDER — MIDAZOLAM HCL 2 MG/2ML IJ SOLN
INTRAMUSCULAR | Status: AC
  Filled 2020-09-24: qty 2

## 2020-09-24 MED ORDER — SODIUM CHLORIDE 0.9 % IV SOLN: INTRAVENOUS | Status: DC

## 2021-07-20 ENCOUNTER — Other Ambulatory Visit: Payer: Self-pay

## 2021-07-20 DIAGNOSIS — F17210 Nicotine dependence, cigarettes, uncomplicated: Secondary | ICD-10-CM | POA: Insufficient documentation

## 2021-07-20 DIAGNOSIS — Z8719 Personal history of other diseases of the digestive system: Secondary | ICD-10-CM | POA: Insufficient documentation

## 2021-07-20 DIAGNOSIS — R1084 Generalized abdominal pain: Secondary | ICD-10-CM | POA: Insufficient documentation

## 2021-07-20 LAB — COMPREHENSIVE METABOLIC PANEL
ALT: 11 U/L (ref 0–44)
AST: 20 U/L (ref 15–41)
Albumin: 3.3 g/dL — ABNORMAL LOW (ref 3.5–5.0)
Alkaline Phosphatase: 42 U/L (ref 38–126)
Anion gap: 6 (ref 5–15)
BUN: 9 mg/dL (ref 6–20)
CO2: 27 mmol/L (ref 22–32)
Calcium: 8.8 mg/dL — ABNORMAL LOW (ref 8.9–10.3)
Chloride: 108 mmol/L (ref 98–111)
Creatinine, Ser: 0.88 mg/dL (ref 0.61–1.24)
GFR, Estimated: >60 mL/min (ref >60)
Glucose, Bld: 127 mg/dL — ABNORMAL HIGH (ref 70–99)
Potassium: 3.6 mmol/L (ref 3.5–5.1)
Sodium: 141 mmol/L (ref 135–145)
Total Bilirubin: 0.7 mg/dL (ref 0.3–1.2)
Total Protein: 5.4 g/dL — ABNORMAL LOW (ref 6.5–8.1)

## 2021-07-20 LAB — LIPASE, BLOOD: Lipase: 39 U/L (ref 11–51)

## 2021-07-20 LAB — CBC
Hemoglobin: 15.5 g/dL (ref 13.0–17.0)
Platelets: 146 10*3/uL — ABNORMAL LOW (ref 150–400)
WBC: 5.4 10*3/uL (ref 4.0–10.5)

## 2021-07-20 NOTE — ED Notes (Signed)
Patient aware that we need urine sample for testing, unable at this time. Pt given instruction on providing urine sample when able to do so.   

## 2021-07-20 NOTE — ED Triage Notes (Signed)
Burning pain in the lower abdominal area, diarrhea, vomiting. Hx of Chron's

## 2021-07-21 ENCOUNTER — Emergency Department: Payer: Self-pay

## 2021-07-21 ENCOUNTER — Emergency Department
Admission: EM | Admit: 2021-07-21 | Discharge: 2021-07-21 | Disposition: A | Discharge status: Home / Self Care | Payer: Self-pay | Attending: Emergency Medicine | Admitting: Emergency Medicine

## 2021-07-21 DIAGNOSIS — R1084 Generalized abdominal pain: Secondary | ICD-10-CM

## 2021-07-21 LAB — URINALYSIS, COMPLETE (UACMP) WITH MICROSCOPIC
Bacteria, UA: NONE SEEN
Bilirubin Urine: NEGATIVE
Glucose, UA: NEGATIVE mg/dL
Hgb urine dipstick: NEGATIVE
Ketones, ur: 5 mg/dL — AB
Leukocytes,Ua: NEGATIVE
Nitrite: NEGATIVE
Protein, ur: NEGATIVE mg/dL
Specific Gravity, Urine: 1.025 (ref 1.005–1.030)
pH: 5 (ref 5.0–8.0)

## 2021-07-21 MED ORDER — FENTANYL CITRATE PF 50 MCG/ML IJ SOSY
50.0000 ug | PREFILLED_SYRINGE | Freq: Once | INTRAMUSCULAR | Status: AC
  Administered 2021-07-21: 50 ug via INTRAVENOUS
  Filled 2021-07-21: qty 1

## 2021-07-21 MED ORDER — DICYCLOMINE HCL 10 MG PO CAPS: 10.0000 mg | ORAL_CAPSULE | Freq: Four times a day (QID) | ORAL | 0 refills | Status: DC

## 2021-07-21 MED ORDER — IOHEXOL 350 MG/ML SOLN
80.0000 mL | Freq: Once | INTRAVENOUS | Status: AC | PRN
  Administered 2021-07-21: 80 mL via INTRAVENOUS
  Filled 2021-07-21: qty 80

## 2021-07-21 MED ORDER — ONDANSETRON 4 MG PO TBDP: 4.0000 mg | ORAL_TABLET | Freq: Three times a day (TID) | ORAL | 0 refills | Status: DC | PRN

## 2021-07-21 MED ORDER — LACTATED RINGERS IV BOLUS
1000.0000 mL | Freq: Once | INTRAVENOUS | Status: AC
  Administered 2021-07-21: 1000 mL via INTRAVENOUS

## 2021-07-21 MED ORDER — ONDANSETRON HCL 4 MG/2ML IJ SOLN
4.0000 mg | Freq: Once | INTRAMUSCULAR | Status: AC
  Administered 2021-07-21: 4 mg via INTRAVENOUS
  Filled 2021-07-21: qty 2

## 2021-07-21 NOTE — ED Notes (Signed)
Pt asking for gingerale advised pt he will have to wait to speak with MD.

## 2021-07-21 NOTE — ED Provider Notes (Signed)
[facility] Emergency Department Provider Note  ____________________________________________  Time seen: Approximately 2:42 AM  I have reviewed the triage vital signs and the nursing notes.   HISTORY  Chief Complaint Abdominal Pain   HPI Sean Gallegos is a 35 y.o. male with a history of Crohn's disease, IBS, kidney stone, substance abuse, bipolar disorder who presents for evaluation of abdominal pain.  Patient does not take any medications for his Crohn's disease.  He reports taking ibuprofen when the pain is severe.  He reports daily abdominal pain however over the last week the pain has been more severe.  The pain is diffuse, stabbing, constant, currently severe, and radiates to his right flank.  No fever, nausea, vomiting, diarrhea, constipation, chest pain, shortness of breath, dysuria, hematuria.  Patient reports that the pain feels similar to prior flares of Crohn's.  He does not have a doctor who follows him for this condition.   Past Medical History:  Diagnosis Date   Anxiety    Arthritis    Bipolar disorder (HCC)    Crohn disease (HCC)    Depression    Epididymitis    Herpes simplex type 2 infection    IBS (irritable bowel syndrome)    Kidney calculus    MDD (major depressive disorder)    Mood disorder (HCC)    Substance abuse (HCC)     Patient Active Problem List   Diagnosis Date Noted   Depression with suicidal ideation 09/15/2020   Anxiety, generalized 12/20/2012   Bipolar affective disorder (HCC) 12/20/2012   Epididymitis 08/03/2012   Vitamin D deficiency 08/03/2012   Genital herpes simplex 06/04/2012   Irritable bowel syndrome 08/04/2011   Predominant disturbance of emotions 05/31/2006    No past surgical history on file.  Prior to Admission medications   Medication Sig Start Date End Date Taking? Authorizing Provider  dicyclomine (BENTYL) 10 MG capsule Take 1 capsule (10 mg total) by mouth 4 (four) times daily for 14 days.  07/21/21 08/04/21 Yes Lukka Black, [place], [place]  ondansetron (ZOFRAN ODT) 4 MG disintegrating tablet Take 1 tablet (4 mg total) by mouth every 8 (eight) hours as needed. 07/21/21  Yes Adah Stoneberg, [place], [place]  acyclovir (ZOVIRAX) 800 MG tablet Take by mouth.    [provider]  pantoprazole (PROTONIX) 40 MG tablet Take 1 tablet by mouth daily. 11/05/19   [provider]  PARoxetine (PAXIL) 20 MG tablet Take by mouth. 08/15/15   [provider]    Allergies Gluten meal and Other  No family history on file.  Social History Social History   Tobacco Use   Smoking status: Every Day    Packs/day: 1.00    Types: Cigarettes   Smokeless tobacco: Never  Substance Use Topics   Alcohol use: Not Currently   Drug use: Not Currently    Types: Marijuana    Review of Systems  Constitutional: Negative for fever. Eyes: Negative for visual changes. ENT: Negative for sore throat. Neck: No neck pain  Cardiovascular: Negative for chest pain. Respiratory: Negative for shortness of breath. Gastrointestinal: + diffuse abdominal pain. no vomiting or diarrhea. Genitourinary: Negative for dysuria. Musculoskeletal: Negative for back pain. Skin: Negative for rash. Neurological: Negative for headaches, weakness or numbness. Psych: No SI or HI  ____________________________________________   PHYSICAL EXAM:  VITAL SIGNS: ED Triage Vitals  Enc Vitals Group     BP 07/20/21 2136 124/75     Pulse Rate 07/20/21 2136 78     Resp 07/20/21 2136  16     Temp 07/20/21 2136 98.5 F (36.9 C)     Temp Source 07/20/21 2136 Oral     SpO2 07/20/21 2136 98 %     Weight 07/20/21 2137 130 lb (59 kg)     Height 07/20/21 2137 6\' 1"  (1.854 m)     Head Circumference --      Peak Flow --      Pain Score 07/20/21 2136 0     Pain Loc --      Pain Edu? --      Excl. in GC? --     Constitutional: Alert and oriented. Well appearing and in no apparent distress. HEENT:      Head: Normocephalic and  atraumatic.         Eyes: Conjunctivae are normal. Sclera is non-icteric.       Mouth/Throat: Mucous membranes are moist.       Neck: Supple with no signs of meningismus. Cardiovascular: Regular rate and rhythm. No murmurs, gallops, or rubs. 2+ symmetrical distal pulses are present in all extremities. No JVD. Respiratory: Normal respiratory effort. Lungs are clear to auscultation bilaterally.  Gastrointestinal: Soft, diffusely tender to palpation with no localized tenderness, rebound or guarding, no distention Genitourinary: No CVA tenderness. Musculoskeletal:  No edema, cyanosis, or erythema of extremities. Neurologic: Normal speech and language. Face is symmetric. Moving all extremities. No gross focal neurologic deficits are appreciated. Skin: Skin is warm, dry and intact. No rash noted. Psychiatric: Mood and affect are normal. Speech and behavior are normal.  ____________________________________________   LABS (all labs ordered are listed, but only abnormal results are displayed)  Labs Reviewed  COMPREHENSIVE METABOLIC PANEL - Abnormal; Notable for the following components:      Result Value   Glucose, Bld 127 (*)    Calcium 8.8 (*)    Total Protein 5.4 (*)    Albumin 3.3 (*)    All other components within normal limits  CBC - Abnormal; Notable for the following components:   Platelets 146 (*)    All other components within normal limits  URINALYSIS, COMPLETE (UACMP) WITH MICROSCOPIC - Abnormal; Notable for the following components:   Color, Urine YELLOW (*)    APPearance HAZY (*)    Ketones, ur 5 (*)    All other components within normal limits  LIPASE, BLOOD   ____________________________________________  EKG  none  ____________________________________________  RADIOLOGY  I have personally reviewed the images performed during this visit and I agree with the Radiologist's read.   Interpretation by Radiologist:  CT ABDOMEN PELVIS W CONTRAST  Result Date:  07/21/2021 CLINICAL DATA:  Burning in the lower abdominal area with diarrhea and vomiting. History of Crohn's disease EXAM: CT ABDOMEN AND PELVIS WITH CONTRAST TECHNIQUE: Multidetector CT imaging of the abdomen and pelvis was performed using the standard protocol following bolus administration of intravenous contrast. CONTRAST:  25mL OMNIPAQUE IOHEXOL 350 MG/ML SOLN COMPARISON:  09/01/2020 FINDINGS: Lower chest:  No contributory findings. Hepatobiliary: No focal liver abnormality.No evidence of biliary obstruction or stone. Pancreas: Unremarkable. Spleen: Unremarkable. Adrenals/Urinary Tract: Negative adrenals. No hydronephrosis or stone. Unremarkable bladder. Stomach/Bowel:  No obstruction. No appendicitis. Vascular/Lymphatic: No acute vascular abnormality. No mass or adenopathy. Reproductive:No pathologic findings. Other: No ascites or pneumoperitoneum. Musculoskeletal: No acute abnormalities.  L4 limbus vertebra IMPRESSION: Negative CT.  No obstruction or visible inflammation. Electronically Signed   By: 09/03/2020 M.D.   On: 07/21/2021 04:24     ____________________________________________   PROCEDURES  Procedure(s)  performed: None Procedures Critical Care performed:  None ____________________________________________   INITIAL IMPRESSION / ASSESSMENT AND PLAN / ED COURSE  35 y.o. male with a history of Crohn's disease, IBS, kidney stone, substance abuse, bipolar disorder who presents for evaluation of abdominal pain.  Patient with constant abdominal pain and now a week of worsening pain.  He is well-appearing in no distress with normal vital signs.  Abdomen is soft and nondistended with diffuse mild tenderness, no localized tenderness, rebound or guarding.  Differential diagnosis including Crohn's flare versus IBS versus kidney stone versus pyelonephritis versus diverticulitis versus appendicitis versus gallbladder pathology versus pancreatitis.  Will give IV fentanyl, IV Zofran, and get  a CT abdomen pelvis.  Labs showing no leukocytosis, stable mild thrombocytosis.  UA negative for blood or UTI 5.  CMP with no acute findings.  Lipase is normal.  Old medical records reviewed including prior imaging showing the patient does have a history of Crohn's  _________________________ 4:46 AM on 07/21/2021 ----------------------------------------- CT abdomen pelvis showing no acute pathology to explain patient's pain.  Will discharge home with a prescription for Bentyl and Zofran.  Will refer to GI for management of Crohn's disease.  Discussed my standard return precautions       _____________________________________________ Please note:  Patient was evaluated in Emergency Department today for the symptoms described in the history of present illness. Patient was evaluated in the context of the global COVID-19 pandemic, which necessitated consideration that the patient might be at risk for infection with the SARS-CoV-2 virus that causes COVID-19. Institutional protocols and algorithms that pertain to the evaluation of patients at risk for COVID-19 are in a state of rapid change based on information released by regulatory bodies including the CDC and federal and state organizations. These policies and algorithms were followed during the patient's care in the ED.  Some ED evaluations and interventions may be delayed as a result of limited staffing during the pandemic.   [place] Controlled Substance Database was reviewed by me. ____________________________________________   FINAL CLINICAL IMPRESSION(S) / ED DIAGNOSES   Final diagnoses:  Generalized abdominal pain      NEW MEDICATIONS STARTED DURING THIS VISIT:  ED Discharge Orders          Ordered    dicyclomine (BENTYL) 10 MG capsule  4 times daily        07/21/21 0446    ondansetron (ZOFRAN ODT) 4 MG disintegrating tablet  Every 8 hours PRN        07/21/21 0446             Note:  This document was prepared using Dragon  voice recognition software and may include unintentional dictation errors.    Don Perking, [place], MD 07/21/21 [phone]

## 2021-07-21 NOTE — ED Notes (Signed)
Pt back from CT

## 2021-07-21 NOTE — ED Notes (Signed)
E-signature pad unavailable - Pt verbalized understanding of D/C information - no additional concerns at this time.   Pt provided a work note.

## 2021-07-21 NOTE — Discharge Instructions (Signed)

## 2021-07-21 NOTE — ED Notes (Signed)
Pt to CT

## 2021-11-21 ENCOUNTER — Inpatient Hospital Stay
Admission: EM | Admit: 2021-11-21 | Discharge: 2021-11-22 | DRG: 387 | Discharge status: Against Medical Advice | Payer: Self-pay | Attending: Family Medicine | Admitting: Family Medicine

## 2021-11-21 ENCOUNTER — Other Ambulatory Visit: Payer: Self-pay

## 2021-11-21 DIAGNOSIS — K219 Gastro-esophageal reflux disease without esophagitis: Secondary | ICD-10-CM | POA: Diagnosis present

## 2021-11-21 DIAGNOSIS — Z20822 Contact with and (suspected) exposure to covid-19: Secondary | ICD-10-CM | POA: Diagnosis present

## 2021-11-21 DIAGNOSIS — K5000 Crohn's disease of small intestine without complications: Secondary | ICD-10-CM

## 2021-11-21 DIAGNOSIS — Z91018 Allergy to other foods: Secondary | ICD-10-CM

## 2021-11-21 DIAGNOSIS — K50811 Crohn's disease of both small and large intestine with rectal bleeding: Principal | ICD-10-CM | POA: Diagnosis present

## 2021-11-21 DIAGNOSIS — F17210 Nicotine dependence, cigarettes, uncomplicated: Secondary | ICD-10-CM | POA: Diagnosis present

## 2021-11-21 DIAGNOSIS — F319 Bipolar disorder, unspecified: Secondary | ICD-10-CM | POA: Diagnosis present

## 2021-11-21 DIAGNOSIS — K50111 Crohn's disease of large intestine with rectal bleeding: Secondary | ICD-10-CM | POA: Diagnosis present

## 2021-11-21 DIAGNOSIS — Z79899 Other long term (current) drug therapy: Secondary | ICD-10-CM

## 2021-11-21 DIAGNOSIS — R1084 Generalized abdominal pain: Secondary | ICD-10-CM

## 2021-11-21 LAB — CBC
HCT: 43 % (ref 39.0–52.0)
Hemoglobin: 16.1 g/dL (ref 13.0–17.0)
MCH: 34.4 pg — ABNORMAL HIGH (ref 26.0–34.0)
MCHC: 37.4 g/dL — ABNORMAL HIGH (ref 30.0–36.0)
MCV: 91.9 fL (ref 80.0–100.0)
Platelets: 164 10*3/uL (ref 150–400)
RBC: 4.68 MIL/uL (ref 4.22–5.81)
RDW: 13.4 % (ref 11.5–15.5)
WBC: 7.4 10*3/uL (ref 4.0–10.5)
nRBC: 0 % (ref 0.0–0.2)

## 2021-11-21 LAB — COMPREHENSIVE METABOLIC PANEL
ALT: 13 U/L (ref 0–44)
AST: 18 U/L (ref 15–41)
Albumin: 3.5 g/dL (ref 3.5–5.0)
Alkaline Phosphatase: 54 U/L (ref 38–126)
Anion gap: 5 (ref 5–15)
BUN: 11 mg/dL (ref 6–20)
CO2: 25 mmol/L (ref 22–32)
Calcium: 9.1 mg/dL (ref 8.9–10.3)
Chloride: 106 mmol/L (ref 98–111)
Creatinine, Ser: 0.76 mg/dL (ref 0.61–1.24)
GFR, Estimated: >60 mL/min (ref >60)
Glucose, Bld: 104 mg/dL — ABNORMAL HIGH (ref 70–99)
Potassium: 4.5 mmol/L (ref 3.5–5.1)
Sodium: 136 mmol/L (ref 135–145)
Total Bilirubin: 0.8 mg/dL (ref 0.3–1.2)
Total Protein: 5.9 g/dL — ABNORMAL LOW (ref 6.5–8.1)

## 2021-11-21 LAB — URINALYSIS, ROUTINE W REFLEX MICROSCOPIC
Bilirubin Urine: NEGATIVE
Glucose, UA: NEGATIVE mg/dL
Hgb urine dipstick: NEGATIVE
Ketones, ur: NEGATIVE mg/dL
Leukocytes,Ua: NEGATIVE
Nitrite: NEGATIVE
Protein, ur: NEGATIVE mg/dL
Specific Gravity, Urine: >1.03 — ABNORMAL HIGH (ref 1.005–1.030)
pH: 6 (ref 5.0–8.0)

## 2021-11-21 LAB — TYPE AND SCREEN
ABO/RH(D): O POS
Antibody Screen: NEGATIVE

## 2021-11-21 LAB — LIPASE, BLOOD: Lipase: 48 U/L (ref 11–51)

## 2021-11-21 MED ORDER — MORPHINE SULFATE (PF) 4 MG/ML IV SOLN
4.0000 mg | Freq: Once | INTRAVENOUS | Status: AC
  Administered 2021-11-21: 4 mg via INTRAVENOUS
  Filled 2021-11-21: qty 1

## 2021-11-21 MED ORDER — ONDANSETRON HCL 4 MG/2ML IJ SOLN
4.0000 mg | Freq: Once | INTRAMUSCULAR | Status: AC
Start: 2021-11-21 — End: 2021-11-21
  Administered 2021-11-21: 4 mg via INTRAVENOUS
  Filled 2021-11-21: qty 2

## 2021-11-21 MED ORDER — SODIUM CHLORIDE 0.9 % IV BOLUS (SEPSIS)
1000.0000 mL | Freq: Once | INTRAVENOUS | Status: AC
  Administered 2021-11-21: 1000 mL via INTRAVENOUS

## 2021-11-21 NOTE — ED Provider Triage Note (Signed)
Emergency Medicine Provider Triage Evaluation Note  Terren Kallstrom , a 36 y.o. male  was evaluated in triage.  Pt complains of patient reports to the emergency room with complaint of lower abdominal pain.  He states history of Crohn's disease.  Patient reports that for the past 2 days he has been having periods of urinary and bowel incontinence.  He also states that he has been febrile with a max temperature of 100.  He states he has had dark concentrated urine and dark stools with with some bright red blood mixed in as well.  He denies burning or pain with urination..  Review of Systems  Positive: Positive for blood in stool, urinary and bowel incontinence, fever, abdominal pain Negative: Nausea, vomiting, diarrhea  Physical Exam  BP 116/83 (BP Location: Left Arm)  Gen:   Awake, no distress   Resp:  Normal effort  MSK:   Moves extremities without difficulty  Other:    Medical Decision Making  Medically screening exam initiated at 6:37 PM.  Appropriate orders placed.  Eoin Luebbe was informed that the remainder of the evaluation will be completed by another provider, this initial triage assessment does not replace that evaluation, and the importance of remaining in the ED until their evaluation is complete.  Will obtain normal GI bleed protocol labs.   Willaim Rayas, NP 11/21/21 1840

## 2021-11-21 NOTE — ED Provider Notes (Signed)
[facility] Provider Note    Event Date/Time   First MD Initiated Contact with Patient 11/21/21 2301     (approximate)   History   Abdominal Pain   HPI  Sean Gallegos is a 36 y.o. male with history of Crohn's disease, bipolar disorder who presents to the emergency department with complaints of severe diffuse abdominal pain, nausea, vomiting, diarrhea with bloody stools.  No previous abdominal surgery.  States he is having dark urine and dysuria but no discharge, testicular pain or swelling.  States he thinks he is having a Crohn's flare.  States he did previously have a gastroenterologist in [place] but does not have a local physician now that he lives here and [place].  He has not on any medication for his Crohn's disease.   History provided by patient.      Past Medical History:  Diagnosis Date   Anxiety    Arthritis    Bipolar disorder ([place])    Crohn disease ([place])    Depression    Epididymitis    Herpes simplex type 2 infection    IBS (irritable bowel syndrome)    Kidney calculus    MDD (major depressive disorder)    Mood disorder ([place])    Substance abuse ([place])     History reviewed. No pertinent surgical history.  MEDICATIONS:  Prior to Admission medications   Medication Sig Start Date End Date Taking? Authorizing Provider  acyclovir (ZOVIRAX) 800 MG tablet Take by mouth.    [provider]  dicyclomine (BENTYL) 10 MG capsule Take 1 capsule (10 mg total) by mouth 4 (four) times daily for 14 days. 07/21/21 08/04/21  Rudene Re, MD  ondansetron (ZOFRAN ODT) 4 MG disintegrating tablet Take 1 tablet (4 mg total) by mouth every 8 (eight) hours as needed. 07/21/21   Rudene Re, MD  pantoprazole (PROTONIX) 40 MG tablet Take 1 tablet by mouth daily. 11/05/19   [provider]  PARoxetine (PAXIL) 20 MG tablet Take by mouth. 08/15/15   [provider]    Physical Exam   Triage Vital Signs: ED  Triage Vitals  Enc Vitals Group     BP 11/21/21 1836 116/83     Pulse Rate 11/21/21 1838 86     Resp 11/21/21 1838 18     Temp 11/21/21 1838 98.5 F (36.9 C)     Temp Source 11/21/21 1838 Oral     SpO2 11/21/21 1838 99 %     Weight 11/21/21 1838 135 lb (61.2 kg)     Height 11/21/21 1838 6\' 1"  (1.854 m)     Head Circumference --      Peak Flow --      Pain Score 11/21/21 1834 10     Pain Loc --      Pain Edu? --      Excl. in [place]? --     Most recent vital signs: Vitals:   11/22/21 0000 11/22/21 0200  BP: 109/72 102/68  Pulse: 78 79  Resp: 16   Temp:    SpO2: 99% 100%    CONSTITUTIONAL: Alert and oriented and responds appropriately to questions.  Appears uncomfortable, moaning in pain HEAD: Normocephalic, atraumatic EYES: Conjunctivae clear, pupils appear equal, sclera nonicteric ENT: normal nose; moist mucous membranes NECK: Supple, normal ROM CARD: RRR; S1 and S2 appreciated; no murmurs, no clicks, no rubs, no gallops RESP: Normal chest excursion without splinting or tachypnea; breath sounds clear and equal bilaterally; no wheezes, no  rhonchi, no rales, no hypoxia or respiratory distress, speaking full sentences ABD/GI: Normal bowel sounds; non-distended; soft, diffusely tender to palpation without guarding or rebound, no peritoneal signs RECTAL:  Normal rectal tone, no gross blood or melena, guaiac slightly positive, no hemorrhoids appreciated, nontender rectal exam, no fecal impaction. Chaperone present. BACK: The back appears normal EXT: Normal ROM in all joints; no deformity noted, no edema; no cyanosis SKIN: Normal color for age and race; warm; no rash on exposed skin NEURO: Moves all extremities equally, normal speech PSYCH: The patient's mood and manner are appropriate.   ED Results / Procedures / Treatments   LABS: (all labs ordered are listed, but only abnormal results are displayed) Labs Reviewed  COMPREHENSIVE METABOLIC PANEL - Abnormal; Notable for the  following components:      Result Value   Glucose, Bld 104 (*)    Total Protein 5.9 (*)    All other components within normal limits  CBC - Abnormal; Notable for the following components:   MCH 34.4 (*)    MCHC 37.4 (*)    All other components within normal limits  URINALYSIS, ROUTINE W REFLEX MICROSCOPIC - Abnormal; Notable for the following components:   Color, Urine YELLOW (*)    APPearance CLEAR (*)    Specific Gravity, Urine >1.030 (*)    Bacteria, UA RARE (*)    All other components within normal limits  RESP PANEL BY RT-PCR (FLU A&B, COVID) ARPGX2  LIPASE, BLOOD  HIV ANTIBODY (ROUTINE TESTING W REFLEX)  BASIC METABOLIC PANEL  CBC  TYPE AND SCREEN     EKG:   RADIOLOGY: My personal review and interpretation of imaging: CT scan shows inflammation of the terminal ileum.  I have personally reviewed all radiology reports.   CT ABDOMEN PELVIS W CONTRAST  Result Date: 11/22/2021 CLINICAL DATA:  Abdominal pain, acute, nonlocalized. states that he is having complications from his Crohn's disease- pt states that he is having dizziness, abd pain, dark urine, and dark stool - pt also states he had a fever of 100 and vomiting on Thursday EXAM: CT ABDOMEN AND PELVIS WITH CONTRAST TECHNIQUE: Multidetector CT imaging of the abdomen and pelvis was performed using the standard protocol following bolus administration of intravenous contrast. RADIATION DOSE REDUCTION: This exam was performed according to the departmental dose-optimization program which includes automated exposure control, adjustment of the mA and/or kV according to patient size and/or use of iterative reconstruction technique. CONTRAST:  60mL OMNIPAQUE IOHEXOL 300 MG/ML  SOLN COMPARISON:  CT abdomen pelvis 07/21/2021, CT abdomen pelvis 09/01/2020 FINDINGS: Lower chest: No acute abnormality. Hepatobiliary: No focal liver abnormality. No gallstones, gallbladder wall thickening, or pericholecystic fluid. No biliary dilatation.  Pancreas: No focal lesion. Normal pancreatic contour. No surrounding inflammatory changes. No main pancreatic ductal dilatation. CT Normal in size without focal abnormality. Spleen: Normal in size without focal abnormality. Adrenals/Urinary Tract: No adrenal nodule bilaterally. Bilateral kidneys enhance symmetrically. No hydronephrosis. No hydroureter. The urinary bladder is unremarkable. Stomach/Bowel: Stomach is within normal limits. Bowel wall thickening and mucosal hyperemia of the terminal ileum (5:22-33). No evidence of large bowel wall thickening or dilatation. Query partially visualized normal appendix. Vascular/Lymphatic: No abdominal aorta or iliac aneurysm. No abdominal, pelvic, or inguinal lymphadenopathy. Reproductive: Prostate is unremarkable. Other: No intraperitoneal free fluid. No intraperitoneal free gas. No organized fluid collection. Musculoskeletal: No abdominal wall hernia or abnormality. No suspicious lytic or blastic osseous lesions. No acute displaced fracture. Multilevel degenerative changes of the spine. IMPRESSION: Terminal ileitis. Differential diagnosis  of etiology includes inflammation (such as Crohn's), infection, less likely ischemia. Electronically Signed   By: Iven Finn M.D.   On: 11/22/2021 00:43     PROCEDURES:  Critical Care performed: No     Procedures    IMPRESSION / MDM / ASSESSMENT AND PLAN / ED COURSE  I reviewed the triage vital signs and the nursing notes.    Patient here with complaints of diffuse abdominal pain, vomiting and diarrhea with concerns for possible Crohn's flare.     DIFFERENTIAL DIAGNOSIS (includes but not limited to):   Crohn's flare, gastroenteritis, colitis, diverticulitis, abscess, fistula, appendicitis, cholecystitis, pancreatitis, UTI, pyelonephritis, kidney stone, gastritis   PLAN: Labs, urine, CT of the abdomen pelvis, pain and nausea medicine, IV fluids.   MEDICATIONS GIVEN IN ED: Medications  0.9 %  sodium  chloride infusion ( Intravenous New Bag/Given 11/22/21 0203)  0.9 %  sodium chloride infusion (has no administration in time range)  acetaminophen (TYLENOL) tablet 650 mg (has no administration in time range)    Or  acetaminophen (TYLENOL) suppository 650 mg (has no administration in time range)  traZODone (DESYREL) tablet 25 mg (has no administration in time range)  ondansetron (ZOFRAN) tablet 4 mg (has no administration in time range)    Or  ondansetron (ZOFRAN) injection 4 mg (has no administration in time range)  magnesium hydroxide (MILK OF MAGNESIA) suspension 30 mL (has no administration in time range)  methylPREDNISolone sodium succinate (SOLU-MEDROL) 40 mg/mL injection 40 mg (has no administration in time range)  morphine 2 MG/ML injection 2 mg (has no administration in time range)  sodium chloride 0.9 % bolus 1,000 mL (0 mLs Intravenous Stopped 11/22/21 0202)  morphine 4 MG/ML injection 4 mg (4 mg Intravenous Given 11/21/21 2359)  ondansetron (ZOFRAN) injection 4 mg (4 mg Intravenous Given 11/21/21 2359)  iohexol (OMNIPAQUE) 300 MG/ML solution 80 mL (80 mLs Intravenous Contrast Given 11/22/21 0014)  HYDROmorphone (DILAUDID) injection 1 mg (1 mg Intravenous Given 11/22/21 0204)  methylPREDNISolone sodium succinate (SOLU-MEDROL) 125 mg/2 mL injection 60 mg (60 mg Intravenous Given 11/22/21 0203)     ED COURSE: Patient's labs show no leukocytosis.  Normal hemoglobin.  Normal electrolytes, renal function, LFTs and lipase.  Urine shows no sign of infection.  COVID and flu negative.  CT of the abdomen pelvis obtained shows terminal ileitis likely due to Crohn's disease.  Patient reports he is still in significant pain after morphine and is requesting admission.  Will give Dilaudid and IV Solu-Medrol.  Will discuss with hospitalist for admission for possible Crohn's flare, pain control.  I also worry that this patient could be lost outpatient follow-up.  He does not have a local gastroenterologist  and may benefit from being on medications to prevent Crohn's flares in the future.   CONSULTS:  Consulted and discussed patient's case with hospitalist, Dr. Sidney Ace.  I have recommended admission and consulting physician agrees and will place admission orders.  Patient (and family if present) agree with this plan.   I reviewed all nursing notes, vitals, pertinent previous records.  All labs, EKGs, imaging ordered have been independently reviewed and interpreted by myself.    OUTSIDE RECORDS REVIEWED: I have reviewed all of his records in [place] that are available in care everywhere.   It appears the last time he was seen by gastroenterology was in April 2009 for diffuse abdominal pain, fecal urgency and loss of appetite.  It does not appear there was a diagnosis of Crohn's disease at that time.  I am not able to see any notes of a previous endoscopy or colonoscopy.  Patient states that he was given the diagnosis of Crohn's disease in [place].   He had multiple visits to express care in the ED for abdominal pain.  He was negative for H. pylori in October 2020.        FINAL CLINICAL IMPRESSION(S) / ED DIAGNOSES   Final diagnoses:  Terminal ileitis without complication ([place])  Generalized abdominal pain     Rx / DC Orders   ED Discharge Orders     None        Note:  This document was prepared using Dragon voice recognition software and may include unintentional dictation errors.   Monteen Toops, Delice Bison, DO 11/22/21 [phone]

## 2021-11-21 NOTE — ED Triage Notes (Signed)
Pt states that he is having complications from his Crohn's disease- pt states that he is having dizziness, abd pain, dark urine, and dark stool - pt also states he had a fever of 100 and vomiting on Thursday

## 2021-11-22 ENCOUNTER — Emergency Department: Payer: Self-pay

## 2021-11-22 DIAGNOSIS — R112 Nausea with vomiting, unspecified: Secondary | ICD-10-CM

## 2021-11-22 DIAGNOSIS — R1084 Generalized abdominal pain: Secondary | ICD-10-CM

## 2021-11-22 DIAGNOSIS — K50111 Crohn's disease of large intestine with rectal bleeding: Secondary | ICD-10-CM | POA: Diagnosis present

## 2021-11-22 DIAGNOSIS — F325 Major depressive disorder, single episode, in full remission: Secondary | ICD-10-CM

## 2021-11-22 LAB — CBC
Hemoglobin: 14.7 g/dL (ref 13.0–17.0)
Platelets: 148 10*3/uL — ABNORMAL LOW (ref 150–400)
WBC: 7.5 10*3/uL (ref 4.0–10.5)

## 2021-11-22 LAB — BASIC METABOLIC PANEL
Anion gap: 5 (ref 5–15)
BUN: 8 mg/dL (ref 6–20)
CO2: 24 mmol/L (ref 22–32)
Calcium: 8 mg/dL — ABNORMAL LOW (ref 8.9–10.3)
Chloride: 107 mmol/L (ref 98–111)
Creatinine, Ser: 0.76 mg/dL (ref 0.61–1.24)
GFR, Estimated: >60 mL/min (ref >60)
Glucose, Bld: 98 mg/dL (ref 70–99)
Potassium: 4.3 mmol/L (ref 3.5–5.1)
Sodium: 136 mmol/L (ref 135–145)

## 2021-11-22 LAB — GASTROINTESTINAL PANEL BY PCR, STOOL (REPLACES STOOL CULTURE)

## 2021-11-22 LAB — RESP PANEL BY RT-PCR (FLU A&B, COVID) ARPGX2
Influenza A by PCR: NEGATIVE
Influenza B by PCR: NEGATIVE
SARS Coronavirus 2 by RT PCR: NEGATIVE

## 2021-11-22 LAB — HIV ANTIBODY (ROUTINE TESTING W REFLEX): HIV Screen 4th Generation wRfx: NONREACTIVE

## 2021-11-22 MED ORDER — TRAZODONE HCL 50 MG PO TABS: 25.0000 mg | ORAL_TABLET | Freq: Every evening | ORAL | Status: DC | PRN

## 2021-11-22 MED ORDER — IOHEXOL 300 MG/ML  SOLN
80.0000 mL | Freq: Once | INTRAMUSCULAR | Status: AC | PRN
  Administered 2021-11-22: 80 mL via INTRAVENOUS

## 2021-11-22 MED ORDER — OXYCODONE HCL 5 MG PO TABS: 5.0000 mg | ORAL_TABLET | Freq: Four times a day (QID) | ORAL | Status: DC | PRN

## 2021-11-22 MED ORDER — MAGNESIUM HYDROXIDE 400 MG/5ML PO SUSP: 30.0000 mL | Freq: Every day | ORAL | Status: DC | PRN

## 2021-11-22 MED ORDER — DICYCLOMINE HCL 10 MG PO CAPS: 10.0000 mg | ORAL_CAPSULE | Freq: Four times a day (QID) | ORAL | Status: DC

## 2021-11-22 MED ORDER — MORPHINE SULFATE (PF) 2 MG/ML IV SOLN
2.0000 mg | INTRAVENOUS | Status: DC | PRN
Start: 2021-11-22 — End: 2021-11-22

## 2021-11-22 MED ORDER — METHYLPREDNISOLONE SODIUM SUCC 125 MG IJ SOLR
60.0000 mg | Freq: Once | INTRAMUSCULAR | Status: AC
  Administered 2021-11-22: 60 mg via INTRAVENOUS
  Filled 2021-11-22: qty 2

## 2021-11-22 MED ORDER — METHYLPREDNISOLONE SODIUM SUCC 40 MG IJ SOLR
40.0000 mg | Freq: Three times a day (TID) | INTRAMUSCULAR | Status: DC
  Administered 2021-11-22: 40 mg via INTRAVENOUS
  Filled 2021-11-22: qty 1

## 2021-11-22 MED ORDER — PANTOPRAZOLE SODIUM 40 MG PO TBEC: 40.0000 mg | DELAYED_RELEASE_TABLET | Freq: Every day | ORAL | Status: DC

## 2021-11-22 MED ORDER — HYDROMORPHONE HCL 1 MG/ML IJ SOLN
1.0000 mg | Freq: Once | INTRAMUSCULAR | Status: AC
  Administered 2021-11-22: 1 mg via INTRAVENOUS
  Filled 2021-11-22: qty 1

## 2021-11-22 MED ORDER — ACETAMINOPHEN 650 MG RE SUPP: 650.0000 mg | Freq: Four times a day (QID) | RECTAL | Status: DC | PRN

## 2021-11-22 MED ORDER — PAROXETINE HCL 20 MG PO TABS: 20.0000 mg | ORAL_TABLET | Freq: Every day | ORAL | Status: DC

## 2021-11-22 MED ORDER — ONDANSETRON HCL 4 MG/2ML IJ SOLN: 4.0000 mg | Freq: Four times a day (QID) | INTRAMUSCULAR | Status: DC | PRN

## 2021-11-22 MED ORDER — ACETAMINOPHEN 325 MG PO TABS: 650.0000 mg | ORAL_TABLET | Freq: Four times a day (QID) | ORAL | Status: DC | PRN

## 2021-11-22 MED ORDER — PANTOPRAZOLE SODIUM 40 MG IV SOLR: 40.0000 mg | Freq: Two times a day (BID) | INTRAVENOUS | Status: DC

## 2021-11-22 MED ORDER — ONDANSETRON HCL 4 MG PO TABS: 4.0000 mg | ORAL_TABLET | Freq: Four times a day (QID) | ORAL | Status: DC | PRN

## 2021-11-22 MED ORDER — POTASSIUM CHLORIDE 20 MEQ PO PACK: 40.0000 meq | PACK | Freq: Once | ORAL | Status: DC

## 2021-11-22 MED ORDER — SODIUM CHLORIDE 0.9 % IV SOLN: INTRAVENOUS | Status: DC

## 2021-11-22 MED ORDER — PEG 3350-KCL-NA BICARB-NACL 420 G PO SOLR
4000.0000 mL | Freq: Once | ORAL | Status: AC
  Administered 2021-11-22: 4000 mL via ORAL
  Filled 2021-11-22: qty 4000

## 2021-11-22 MED ORDER — NICOTINE 21 MG/24HR TD PT24
21.0000 mg | MEDICATED_PATCH | Freq: Every day | TRANSDERMAL | Status: DC
  Administered 2021-11-22: 21 mg via TRANSDERMAL

## 2021-11-22 NOTE — ED Notes (Addendum)
This RN was informed by security that primary RN had concerns of pt smoking in the room, as she smelled smoke coming from the patients room. Pt denied that he was smoking in the room. AC was called to come speak with patient about smoking in the room,as this puts the whole department at risk. AC states she will come down and speak with pt.

## 2021-11-22 NOTE — H&P (Addendum)
[place]   PATIENT NAME: Sean Gallegos    MR#:  [id number]  DATE OF BIRTH:  06-12-1986  DATE OF ADMISSION:  11/21/2021  PRIMARY CARE PHYSICIAN: Pcp, No   Patient is coming from: Home  REQUESTING/REFERRING PHYSICIAN: Ward, Delice Bison, DO  CHIEF COMPLAINT:   Chief Complaint  Patient presents with   Abdominal Pain    HISTORY OF PRESENT ILLNESS:  Sean Gallegos is a 36 y.o. African-American male with medical history significant for bipolar disorder, Crohn's disease and major depression, who presented to the ER with acute onset of diffuse abdominal pain radiating from his epigastrium to suprapubic area and extending to the lower quadrants with associated nausea and vomiting since yesterday.  No bilious vomitus or hematemesis.  He admitted to fever with a T-max of 100.7 with mild chills.  He has been having melanotic bowel movements and bloody stools.  No dysuria, oliguria or hematuria or flank pain.  No cough or wheezing or dyspnea.  ED Course: Upon presentation to the ER, vital signs were within normal.  Labs revealed unremarkable CBC and BMP.Marland Kitchen  UA showed specific gravity of more than 1030.  Influenza antigens and COVID-19 PCR came back negative.  Blood group was O+ with negative antibody screen.  Imaging: Abdominal pelvic CT scan revealed terminal ileitis with differential diagnosis including inflammation as in Crohn's, infection and less likely ischemia.  The patient was given 60 mg of IV Solu-Medrol, 4 mg IV morphine sulfate and 4 mg of IV Zofran, 1 L bolus of IV normal saline and 1 mg of IV Dilaudid.  He will be admitted to a medical telemetry bed for further evaluation and management. PAST MEDICAL HISTORY:   Past Medical History:  Diagnosis Date   Anxiety    Arthritis    Bipolar disorder ([place])    Crohn disease ([place])    Depression    Epididymitis    Herpes simplex type 2 infection    IBS (irritable bowel syndrome)    Kidney calculus    MDD (major depressive  disorder)    Mood disorder ([place])    Substance abuse ([place])     PAST SURGICAL HISTORY:  History reviewed. No pertinent surgical history.  SOCIAL HISTORY:   Social History   Tobacco Use   Smoking status: Every Day    Packs/day: 1.00    Types: Cigarettes   Smokeless tobacco: Never  Substance Use Topics   Alcohol use: Not Currently    FAMILY HISTORY:  Positive for CVA in his mother and MI in his aunt.  History is otherwise positive for hypertension and diabetes mellitus. DRUG ALLERGIES:   Allergies  Allergen Reactions   Gluten Meal Other (See Comments)    Per patient   Other Itching    Watery eyes, runny nose, sneezing    REVIEW OF SYSTEMS:   ROS As per history of present illness. All pertinent systems were reviewed above. Constitutional, HEENT, cardiovascular, respiratory, GI, GU, musculoskeletal, neuro, psychiatric, endocrine, integumentary and hematologic systems were reviewed and are otherwise negative/unremarkable except for positive findings mentioned above in the HPI.   MEDICATIONS AT HOME:   Prior to Admission medications   Medication Sig Start Date End Date Taking? Authorizing Provider  acyclovir (ZOVIRAX) 800 MG tablet Take by mouth.    [provider]  dicyclomine (BENTYL) 10 MG capsule Take 1 capsule (10 mg total) by mouth 4 (four) times daily for 14 days. 07/21/21 08/04/21  Rudene Re, MD  ondansetron (ZOFRAN ODT)  4 MG disintegrating tablet Take 1 tablet (4 mg total) by mouth every 8 (eight) hours as needed. 07/21/21   Rudene Re, MD  pantoprazole (PROTONIX) 40 MG tablet Take 1 tablet by mouth daily. 11/05/19   [provider]  PARoxetine (PAXIL) 20 MG tablet Take by mouth. 08/15/15   [provider]      VITAL SIGNS:  Blood pressure 102/68, pulse 79, temperature 98.5 F (36.9 C), temperature source Oral, resp. rate 16, height 6\' 1"  (1.854 m), weight 61.2 kg, SpO2 100 %.  PHYSICAL EXAMINATION:  Physical  Exam  GENERAL:  36 y.o.-year-old African-American male patient lying in the bed with no acute distress.  EYES: Pupils equal, round, reactive to light and accommodation. No scleral icterus. Extraocular muscles intact.  HEENT: Head atraumatic, normocephalic. Oropharynx and nasopharynx clear.  NECK:  Supple, no jugular venous distention. No thyroid enlargement, no tenderness.  LUNGS: Normal breath sounds bilaterally, no wheezing, rales,rhonchi or crepitation. No use of accessory muscles of respiration.  CARDIOVASCULAR: Regular rate and rhythm, S1, S2 normal. No murmurs, rubs, or gallops.  ABDOMEN: Soft, nondistended, with diffuse abdominal tenderness mostly in the right lower quadrant without rebound tenderness guarding or rigidity.. Bowel sounds present. No organomegaly or mass.  EXTREMITIES: No pedal edema, cyanosis, or clubbing.  NEUROLOGIC: Cranial nerves II through XII are intact. Muscle strength 5/5 in all extremities. Sensation intact. Gait not checked.  PSYCHIATRIC: The patient is alert and oriented x 3.  Normal affect and good eye contact. SKIN: No obvious rash, lesion, or ulcer.   LABORATORY PANEL:   CBC Recent Labs  Lab 11/21/21 1836  WBC 7.4  HGB 16.1  HCT 43.0  PLT 164   ------------------------------------------------------------------------------------------------------------------  Chemistries  Recent Labs  Lab 11/21/21 1836  NA 136  K 4.5  CL 106  CO2 25  GLUCOSE 104*  BUN 11  CREATININE 0.76  CALCIUM 9.1  AST 18  ALT 13  ALKPHOS 54  BILITOT 0.8   ------------------------------------------------------------------------------------------------------------------  Cardiac Enzymes No results for input(s): TROPONINI in the last 168 hours. ------------------------------------------------------------------------------------------------------------------  RADIOLOGY:  CT ABDOMEN PELVIS W CONTRAST  Result Date: 11/22/2021 CLINICAL DATA:  Abdominal pain, acute,  nonlocalized. states that he is having complications from his Crohn's disease- pt states that he is having dizziness, abd pain, dark urine, and dark stool - pt also states he had a fever of 100 and vomiting on Thursday EXAM: CT ABDOMEN AND PELVIS WITH CONTRAST TECHNIQUE: Multidetector CT imaging of the abdomen and pelvis was performed using the standard protocol following bolus administration of intravenous contrast. RADIATION DOSE REDUCTION: This exam was performed according to the departmental dose-optimization program which includes automated exposure control, adjustment of the mA and/or kV according to patient size and/or use of iterative reconstruction technique. CONTRAST:  66mL OMNIPAQUE IOHEXOL 300 MG/ML  SOLN COMPARISON:  CT abdomen pelvis 07/21/2021, CT abdomen pelvis 09/01/2020 FINDINGS: Lower chest: No acute abnormality. Hepatobiliary: No focal liver abnormality. No gallstones, gallbladder wall thickening, or pericholecystic fluid. No biliary dilatation. Pancreas: No focal lesion. Normal pancreatic contour. No surrounding inflammatory changes. No main pancreatic ductal dilatation. CT Normal in size without focal abnormality. Spleen: Normal in size without focal abnormality. Adrenals/Urinary Tract: No adrenal nodule bilaterally. Bilateral kidneys enhance symmetrically. No hydronephrosis. No hydroureter. The urinary bladder is unremarkable. Stomach/Bowel: Stomach is within normal limits. Bowel wall thickening and mucosal hyperemia of the terminal ileum (5:22-33). No evidence of large bowel wall thickening or dilatation. Query partially visualized normal appendix. Vascular/Lymphatic: No abdominal aorta or  iliac aneurysm. No abdominal, pelvic, or inguinal lymphadenopathy. Reproductive: Prostate is unremarkable. Other: No intraperitoneal free fluid. No intraperitoneal free gas. No organized fluid collection. Musculoskeletal: No abdominal wall hernia or abnormality. No suspicious lytic or blastic osseous  lesions. No acute displaced fracture. Multilevel degenerative changes of the spine. IMPRESSION: Terminal ileitis. Differential diagnosis of etiology includes inflammation (such as Crohn's), infection, less likely ischemia. Electronically Signed   By: Iven Finn M.D.   On: 11/22/2021 00:43      IMPRESSION AND PLAN:  Principal Problem:   Crohn's disease of colon, with rectal bleeding ([place])  1.  Crohn's disease flare with abdominal pain, nausea and vomiting and GI bleeding. - The patient will be admitted to a medical telemetry bed. - We will follow serial H&H. - We will continue him on IV steroids. - Pain management will be provided. - We will place him on IV PPI therapy. - He will be hydrated with IV normal saline. - We will keep him n.p.o. for now. - GI consult will be obtained. - I notified Dr. Allen Norris about the patient.  2.  Major depression. - We will continue his Paxil.  3.  GERD. - We will continue PPI therapy.  DVT prophylaxis: SCDs.  Medical prophylaxis currently contraindicated due to GI bleeding. Code Status: full code. Family Communication:  The plan of care was discussed in details with the patient (and family). I answered all questions. The patient agreed to proceed with the above mentioned plan. Further management will depend upon hospital course. Disposition Plan: Back to previous home environment Consults called: Gastroenterology. All the records are reviewed and case discussed with ED provider.  Status is: Inpatient   At the time of the admission, it appears that the appropriate admission status for this patient is inpatient.  This is judged to be reasonable and necessary in order to provide the required intensity of service to ensure the patient's safety given the presenting symptoms, physical exam findings and initial radiographic and laboratory data in the context of comorbid conditions.  The patient requires inpatient status due to high intensity of service,  high risk of further deterioration and high frequency of surveillance required.  I certify that at the time of admission, it is my clinical judgment that the patient will require inpatient hospital care extending more than 2 midnights.                            Dispo: The patient is from: Home              Anticipated d/c is to: Home              Patient currently is not medically stable to d/c.              Difficult to place patient: No  Christel Mormon M.D on 11/22/2021 at 2:38 AM  [facility]   From 7 PM-7 AM, contact night-coverage [email or web address]  CC: Primary care physician; Pcp, No

## 2021-11-22 NOTE — Progress Notes (Signed)
Spoke with Dr. Pilar Grammes. Explained what was occurring with patient. Stated he would not discharge patient as not medically stable for discharge, even after being made aware patient's behavior was hazardous to the staff, patient and self.

## 2021-11-22 NOTE — Evaluation (Addendum)
Physical Therapy Evaluation Patient Details Name: Sean Gallegos MRN: [id number] DOB: 10-15-1986 Today's Date: 11/22/2021  History of Present Illness  Pt is a 36 y/o M admitted on 11/21/21 with c/c of abdominal pain. Abdominal pelvic CT scan revealed terminal ileitis with differential diagnosis including inflammation as in Crohn's, infection and less likely ischemia.  GI has been consulted. PMH: anxiety, arthritis, bipolar disorder, Crohn's disease, depression, epididymitis, Herpes simplex 2 infection, IBS, substance abuse  Clinical Impression  Pt seen for PT evaluation with pt agreeable to tx. Pt endorses dizziness/lightheadedness with mobility but was negative for orthostatic hypotension. Pt does frequently reach for objects for support (doorframe, bed, etc) requiring min assist and when ambulating without UE support pt requires mod assist with staggering L<>R. Pt reports need to use restroom so returned & pt requesting using urinal in bed vs standing with assistance. At this time, pt is weak & unstable with mobility and is a higher fall risk. Pt would benefit from trialing RW for gait. Will attempt to see pt again tomorrow to determine true need for STR upon d/c.       BP checked in LUE: Supine: 112/67 mmHg (MAP 81) Sitting: 116/72 mmHg (MAP 87) Standing at 0: 107/73 mmHg (MAP 85)    Recommendations for follow up therapy are one component of a multi-disciplinary discharge planning process, led by the attending physician.  Recommendations may be updated based on patient status, additional functional criteria and insurance authorization.  Follow Up Recommendations Skilled nursing-short term rehab (<3 hours/day)    Assistance Recommended at Discharge PRN  Patient can return home with the following  A lot of help with walking and/or transfers;Assistance with cooking/housework;Assist for transportation;Help with stairs or ramp for entrance    Equipment Recommendations Rolling walker (2 wheels)   Recommendations for Other Services       Functional Status Assessment Patient has had a recent decline in their functional status and demonstrates the ability to make significant improvements in function in a reasonable and predictable amount of time.     Precautions / Restrictions Precautions Precautions: Fall Restrictions Weight Bearing Restrictions: No      Mobility  Bed Mobility Overal bed mobility: Modified Independent                  Transfers Overall transfer level: Needs assistance Equipment used: None Transfers: Sit to/from Stand Sit to Stand: Min assist                Ambulation/Gait Ambulation/Gait assistance: Mod assist;Min assist Gait Distance (Feet): 25 Feet Assistive device: None Gait Pattern/deviations: Decreased step length - right;Decreased step length - left;Decreased stride length;Decreased dorsiflexion - right;Decreased dorsiflexion - left;Staggering left;Staggering right Gait velocity: decreased     General Gait Details: Pt frequently reaching for objects to hold to the entire time (walls, bed, etc). Does stagger L<>R when ambulating without UE support & requires mod assist.  Stairs            Wheelchair Mobility    Modified Rankin (Stroke Patients Only)       Balance Overall balance assessment: Needs assistance Sitting-balance support: Feet supported;Bilateral upper extremity supported Sitting balance-Leahy Scale: Fair     Standing balance support: No upper extremity supported;During functional activity Standing balance-Leahy Scale: Poor                               Pertinent Vitals/Pain Pain Assessment: Faces Faces Pain Scale: Hurts little  more Pain Location: BLE & flank Pain Descriptors / Indicators: Discomfort Pain Intervention(s): Monitored during session    Home Living Family/patient expects to be discharged to:: Private residence Living Arrangements: Non-relatives/Friends   Type of Home:  Apartment (2nd level apartment) Home Access: Stairs to enter   Secretary/administrator of Steps: flight   Home Layout: One level Home Equipment: None      Prior Function               Mobility Comments: Pt reports he was driving prior to admission, living with a friend. Reports several "near falls" prior to admission. Does not clearly answer questions re: PLOF/home set up.       Hand Dominance        Extremity/Trunk Assessment   Upper Extremity Assessment Upper Extremity Assessment: Generalized weakness    Lower Extremity Assessment Lower Extremity Assessment: Generalized weakness    Cervical / Trunk Assessment Cervical / Trunk Assessment: Normal  Communication   Communication: No difficulties  Cognition Arousal/Alertness: Awake/alert Behavior During Therapy: Flat affect Overall Cognitive Status: Within Functional Limits for tasks assessed                                          General Comments      Exercises     Assessment/Plan    PT Assessment Patient needs continued PT services  PT Problem List Decreased strength;Decreased coordination;Pain;Decreased activity tolerance;Decreased balance;Decreased mobility;Decreased safety awareness;Decreased knowledge of use of DME       PT Treatment Interventions DME instruction;Therapeutic exercise;Gait training;Balance training;Stair training;Neuromuscular re-education;Functional mobility training;Therapeutic activities;Patient/family education    PT Goals (Current goals can be found in the Care Plan section)  Acute Rehab PT Goals Patient Stated Goal: get help, get better PT Goal Formulation: With patient Time For Goal Achievement: 12/06/21 Potential to Achieve Goals: Good    Frequency Min 2X/week     Co-evaluation               AM-PAC PT "6 Clicks" Mobility  Outcome Measure Help needed turning from your back to your side while in a flat bed without using bedrails?: None Help  needed moving from lying on your back to sitting on the side of a flat bed without using bedrails?: None Help needed moving to and from a bed to a chair (including a wheelchair)?: A Little Help needed standing up from a chair using your arms (e.g., wheelchair or bedside chair)?: A Little Help needed to walk in hospital room?: A Lot Help needed climbing 3-5 steps with a railing? : A Lot 6 Click Score: 18    End of Session Equipment Utilized During Treatment: Gait belt Activity Tolerance: Patient tolerated treatment well Patient left: in bed;with call bell/phone within reach Nurse Communication: Mobility status PT Visit Diagnosis: Muscle weakness (generalized) (M62.81);Difficulty in walking, not elsewhere classified (R26.2);Unsteadiness on feet (R26.81)    Time: 6948-5462 PT Time Calculation (min) (ACUTE ONLY): 14 min   Charges:   PT Evaluation $PT Eval Low Complexity: 1 Low          Aleda Grana, PT, DPT 11/22/21, 1:26 PM   Sandi Mariscal 11/22/2021, 1:10 PM

## 2021-11-22 NOTE — Consult Note (Signed)
Sean Lame, MD [address] [place], [place] [address] Phone: [phone] Fax : [phone]  Consultation  Referring Provider:     Dr. Sidney Ace Primary Care Physician:  Pcp, No Primary Gastroenterologist:  Dr. Marius Ditch         Reason for Consultation:     Abnormal CT scan with abdominal pain  Date of Admission:  11/21/2021 Date of Consultation:  11/22/2021         HPI:   Sean Gallegos is a 36 y.o. male Who has a history of bipolar disorder and reports having Crohn's disease with a history of major depression.  The patient has been seen multiple times over the years for abdominal pain at [facility] in [place] clinic in [place].  The patient had an attempted colonoscopy many years ago with the procedure being stopped due to abdominal pain.  The patient then had a CT colonography that was reported to be normal with the caveat that inflammation could not be detected well on this type of exam.  The patient reports that he has been told that he has Crohn's disease.  Multiple notes in his charts report that he has irritable bowel syndrome and numerous C-reactive protein blood tests were negative in the past. It appears that the patient also had a virtual visit with a Gastroenterologist back in January 2021 for concern of gastric ulcers but the patient did not answer the phone for his virtual visit. The patient has subsequently moved to this area and has been seen by Dr. Marius Ditch back in November 2021. At that time the patient was sent for multiple labs including C-reactive protein fetal Protected and stool for possible infection.  It appears that the patient was also recommended to have a colonoscopy with evaluation of the terminal ileum. It does not appear that the patient had that procedure done. Other lab work showed him to have normal iron studies B12 and folate. His TPMT activity was normal and his QuantiFeron-TB was negative. His  hepatitis A antibody was negative with his hepatitis B surface antigen being negative quality surface antibody was positive and the core antibody was negative. His C-reactive protein was also negative at that time. The patient was started on IV trial of 40 mg of prednisone daily for 2 weeks by Dr. Marius Ditch and appears not to have followed up after that initial encounter despite being recommended to follow-up in 4 weeks.  The patient states he was very frustrated with his care in [place] and states that he not only was told that he had irritable bowel syndrome and Crohn's disease but states that he was told he had prostate cancer and colon cancer.  I did not see any of this on his records from there except for the diagnosis of irritable bowel syndrome.  He reports he has 10-15 bowel movements every day and reports the stools to be bloody.  He continues to have abdominal pain.  Past Medical History:  Diagnosis Date   Anxiety    Arthritis    Bipolar disorder ([place])    Crohn disease ([place])    Depression    Epididymitis    Herpes simplex type 2 infection    IBS (irritable bowel syndrome)    Kidney calculus    MDD (major depressive disorder)    Mood disorder ([place])    Substance abuse ([place])     History reviewed. No pertinent surgical history.  Prior to Admission medications  Medication Sig Start Date End Date Taking? Authorizing Provider  ibuprofen (ADVIL) 200 MG tablet Take 400-600 mg by mouth every 6 (six) hours as needed for mild pain or moderate pain.   Yes [provider]    No family history on file.   Social History   Tobacco Use   Smoking status: Every Day    Packs/day: 1.00    Types: Cigarettes   Smokeless tobacco: Never  Substance Use Topics   Alcohol use: Not Currently   Drug use: Not Currently    Types: Marijuana    Allergies as of 11/21/2021 - Review Complete 11/21/2021  Allergen Reaction Noted   Gluten meal Other (See Comments) 08/30/2012   Other Itching  07/01/2016    Review of Systems:    All systems reviewed and negative except where noted in HPI.   Physical Exam:  Vital signs in last 24 hours: Temp:  [98.5 F (36.9 C)] 98.5 F (36.9 C) (01/14 1838) Pulse Rate:  [70-86] 80 (01/15 0805) Resp:  [16-20] 16 (01/15 0805) BP: (98-118)/(65-83) 118/71 (01/15 0805) SpO2:  [98 %-100 %] 100 % (01/15 0805) Weight:  [61.2 kg] 61.2 kg (01/14 1838)   General:   Pleasant, cooperative in NAD Head:  Normocephalic and atraumatic. Eyes:   No icterus.   Conjunctiva pink. PERRLA. Ears:  Normal auditory acuity. Neck:  Supple; no masses or thyroidomegaly Lungs: Respirations even and unlabored. Lungs clear to auscultation bilaterally.   No wheezes, crackles, or rhonchi.  Heart:  Regular rate and rhythm;  Without murmur, clicks, rubs or gallops Abdomen:  Soft, nondistended, nontender. Normal bowel sounds. No appreciable masses or hepatomegaly.  No rebound or guarding.  Rectal:  Not performed. Msk:  Symmetrical without gross deformities.    Extremities:  Without edema, cyanosis or clubbing. Neurologic:  Alert and oriented x3;  grossly normal neurologically. Skin:  Intact without significant lesions or rashes. Cervical Nodes:  No significant cervical adenopathy. Psych:  Alert and cooperative. Normal affect.  LAB RESULTS: Recent Labs    11/21/21 1836 11/22/21 0627  WBC 7.4 7.5  HGB 16.1 14.7  HCT 43.0 RESULTS UNAVAILABLE DUE TO INTERFERING SUBSTANCE  PLT 164 148*   BMET Recent Labs    11/21/21 1836 11/22/21 0627  NA 136 136  K 4.5 4.3  CL 106 107  CO2 25 24  GLUCOSE 104* 98  BUN 11 8  CREATININE 0.76 0.76  CALCIUM 9.1 8.0*   LFT Recent Labs    11/21/21 1836  PROT 5.9*  ALBUMIN 3.5  AST 18  ALT 13  ALKPHOS 54  BILITOT 0.8   PT/INR No results for input(s): LABPROT, INR in the last 72 hours.  STUDIES: CT ABDOMEN PELVIS W CONTRAST  Result Date: 11/22/2021 CLINICAL DATA:  Abdominal pain, acute, nonlocalized. states that he  is having complications from his Crohn's disease- pt states that he is having dizziness, abd pain, dark urine, and dark stool - pt also states he had a fever of 100 and vomiting on Thursday EXAM: CT ABDOMEN AND PELVIS WITH CONTRAST TECHNIQUE: Multidetector CT imaging of the abdomen and pelvis was performed using the standard protocol following bolus administration of intravenous contrast. RADIATION DOSE REDUCTION: This exam was performed according to the departmental dose-optimization program which includes automated exposure control, adjustment of the mA and/or kV according to patient size and/or use of iterative reconstruction technique. CONTRAST:  18mL OMNIPAQUE IOHEXOL 300 MG/ML  SOLN COMPARISON:  CT abdomen pelvis 07/21/2021, CT abdomen pelvis 09/01/2020 FINDINGS: Lower chest:  No acute abnormality. Hepatobiliary: No focal liver abnormality. No gallstones, gallbladder wall thickening, or pericholecystic fluid. No biliary dilatation. Pancreas: No focal lesion. Normal pancreatic contour. No surrounding inflammatory changes. No main pancreatic ductal dilatation. CT Normal in size without focal abnormality. Spleen: Normal in size without focal abnormality. Adrenals/Urinary Tract: No adrenal nodule bilaterally. Bilateral kidneys enhance symmetrically. No hydronephrosis. No hydroureter. The urinary bladder is unremarkable. Stomach/Bowel: Stomach is within normal limits. Bowel wall thickening and mucosal hyperemia of the terminal ileum (5:22-33). No evidence of large bowel wall thickening or dilatation. Query partially visualized normal appendix. Vascular/Lymphatic: No abdominal aorta or iliac aneurysm. No abdominal, pelvic, or inguinal lymphadenopathy. Reproductive: Prostate is unremarkable. Other: No intraperitoneal free fluid. No intraperitoneal free gas. No organized fluid collection. Musculoskeletal: No abdominal wall hernia or abnormality. No suspicious lytic or blastic osseous lesions. No acute displaced  fracture. Multilevel degenerative changes of the spine. IMPRESSION: Terminal ileitis. Differential diagnosis of etiology includes inflammation (such as Crohn's), infection, less likely ischemia. Electronically Signed   By: Iven Finn M.D.   On: 11/22/2021 00:43      Impression / Plan:   Assessment: Principal Problem:   Crohn's disease of colon, with rectal bleeding ([place])   Shota Bermudez is a 36 y.o. y/o male with a history of Crohn's disease who is followed with Dr. Marius Ditch in the past.  The patient was supposed to have lab sent off in the past for stool studies but did not have those done.  He has also not followed up with Dr. Marius Ditch since seeing her in 2021.  The patient now is reporting a Crohn's flare with abdominal pain and a CT scan showing terminal ileitis.  Plan:  The patient will be set up for colonoscopy and will be prepped today.  He will also be kept on a clear liquid diet today and n.p.o. after midnight.  The patient will have his stool sent off for fecal calprotectin.  The patient will also have a GI panel sent off to rule out any infectious cause of his GI problems.  Dr. Marius Ditch will be taking over tomorrow and she will perform his colonoscopy.  Thank you for involving me in the care of this patient.      LOS: 0 days   Sean Lame, MD, [facility] 11/22/2021, 8:47 AM,  Pager [phone] 7am-5pm  Check AMION for 5pm -7am coverage and on weekends   Note: This dictation was prepared with Dragon dictation along with smaller phrase technology. Any transcriptional errors that result from this process are unintentional.

## 2021-11-22 NOTE — Hospital Course (Signed)
Mr. Breitenbach is a 36 y.o. M with hx Bipolar disorder who presented with abdominal pain, vomiting and fever as well as melenic and frankly bloody bowel movements for 1 day.  In the ER, CT showed terminal ileitis.  GI were consulted. Patient started on steroids for presumed Crohn's flare.

## 2021-11-22 NOTE — ED Notes (Signed)
This RN took phone in with provider on phone for him to speak with pt

## 2021-11-22 NOTE — ED Notes (Signed)
Smell of smoke coming from room. This RN spoke to pt about smoking in room. Lighter laying in bed beside pt. Pt denies smoking cigarettes in room. This RN notified security and security notifying charge.

## 2021-11-22 NOTE — ED Notes (Signed)
This Clinical research associate was called to room.  Pt found sitting on the edge of his bed with his jacket on and all VS monitoring removed.  Pt reported he wants to go outside.  Pt educated that he cant currently go outside because he is in the hospital.  Pt agreeable to get back into bed and VS monitoring equipment reapplied.

## 2021-11-22 NOTE — ED Notes (Signed)
Pt threw this RN phone on bed in hall and held out arm for IV to be removed. This RN removed IV. Pt refused to talk to this RN and refused to sign AMA papers.

## 2021-11-22 NOTE — Discharge Summary (Signed)
[facility] Discharge Summary   Patient: Sean Gallegos ACZ:[id number]   PCP: Pcp, No DOB: [birth year]   Date of admission: 11/21/2021   Date of discharge: 11/22/2021      Discharge Diagnoses:  Principal Problem:   Crohn's disease of colon, with rectal bleeding ([place]) Active Problems:   Bipolar affective disorder ([place])    Discharge Condition: Mentating well, ambulating without assistance, in no acute distress, vital signs normal    History of present illness:  Sean Gallegos is a 36 y.o. M with hx Bipolar disorder who presented with abdominal pain, vomiting and fever as well as melenic and frankly bloody bowel movements for 1 day.  In the ER, CT showed terminal ileitis.  GI were consulted. Patient started on steroids for presumed Crohn's flare.     Principal discharge diagnosis Likely Crohn's disease with flare    Hospital Course:  * Crohn's disease of colon, with rectal bleeding ([place])- (present on admission)    Bipolar affective disorder ([place])- (present on admission)     Patient was admitted and started on steroids.   Sean Gallegos was evaluated by GI who recommended colonoscopy.    Sean Gallegos began his bowel prep when nursing reportedly entered his room, found it smelled of cigarette smoke, observed his cigarette lighter on the bedside table, and observed a half-smoked cigarette in the trashcan. The patient stated it was an old cigarette that had been in his pocket, Sean Gallegos had not smoked a cigarette, and protested for being accused.    The nursing administrative coordinator for the day met with the patient and set what she believed were reasonable conditions to continued nursing care.  The patient stated these were not reasonable conditions, that Sean Gallegos would leave the hospital.  I spoke with patient, shared with him the risks of leaving the hospital and my recommended treatment plan.  I discussed with GI Dr. Allen Norris, and we agreed mutually not to prescribe steroids at discharge (in my medical opinion,  an unsupervised daily prednisone >40 mg prolonged taper is unsafe)(and that Sean Gallegos had previously been a no show to appointments with his primary gastroenterologist and we could not be sure Sean Gallegos would do so again).  I stated this to patient along with my sincere desire for him to stay.   I shared that nursing administration establisehd standards for safe nursing care that were independent of my medical plan, and were represented by the conditions outlined by the nursing coordinator.  Sean Gallegos stated Sean Gallegos could not abide them and would leave, despite that I could not provide standard of care medical treatment if Sean Gallegos left.  Sean Gallegos has follow up with his primary Gastroenterologist, with whom Sean Gallegos is established.  Sean Gallegos stated Sean Gallegos would call her or go to another hospital.        Patient left the hospital AMA.      Consultations: Gastroenterology  The results of significant diagnostics from this hospitalization (including imaging, microbiology, ancillary and laboratory) are listed below for reference.    Significant Diagnostic Studies: CT ABDOMEN PELVIS W CONTRAST  Result Date: 11/22/2021 CLINICAL DATA:  Abdominal pain, acute, nonlocalized. states that Sean Gallegos is having complications from his Crohn's disease- pt states that Sean Gallegos is having dizziness, abd pain, dark urine, and dark stool - pt also states Sean Gallegos had a fever of 100 and vomiting on Thursday EXAM: CT ABDOMEN AND PELVIS WITH CONTRAST TECHNIQUE: Multidetector CT imaging of the abdomen and pelvis was performed using the standard protocol following bolus administration of intravenous contrast. RADIATION DOSE REDUCTION: This  exam was performed according to the departmental dose-optimization program which includes automated exposure control, adjustment of the mA and/or kV according to patient size and/or use of iterative reconstruction technique. CONTRAST:  65mL OMNIPAQUE IOHEXOL 300 MG/ML  SOLN COMPARISON:  CT abdomen pelvis 07/21/2021, CT abdomen pelvis 09/01/2020 FINDINGS:  Lower chest: No acute abnormality. Hepatobiliary: No focal liver abnormality. No gallstones, gallbladder wall thickening, or pericholecystic fluid. No biliary dilatation. Pancreas: No focal lesion. Normal pancreatic contour. No surrounding inflammatory changes. No main pancreatic ductal dilatation. CT Normal in size without focal abnormality. Spleen: Normal in size without focal abnormality. Adrenals/Urinary Tract: No adrenal nodule bilaterally. Bilateral kidneys enhance symmetrically. No hydronephrosis. No hydroureter. The urinary bladder is unremarkable. Stomach/Bowel: Stomach is within normal limits. Bowel wall thickening and mucosal hyperemia of the terminal ileum (5:22-33). No evidence of large bowel wall thickening or dilatation. Query partially visualized normal appendix. Vascular/Lymphatic: No abdominal aorta or iliac aneurysm. No abdominal, pelvic, or inguinal lymphadenopathy. Reproductive: Prostate is unremarkable. Other: No intraperitoneal free fluid. No intraperitoneal free gas. No organized fluid collection. Musculoskeletal: No abdominal wall hernia or abnormality. No suspicious lytic or blastic osseous lesions. No acute displaced fracture. Multilevel degenerative changes of the spine. IMPRESSION: Terminal ileitis. Differential diagnosis of etiology includes inflammation (such as Crohn's), infection, less likely ischemia. Electronically Signed   By: Iven Finn M.D.   On: 11/22/2021 00:43    Microbiology: Recent Results (from the past 240 hour(s))  Resp Panel by RT-PCR (Flu A&B, Covid)     Status: None   Collection Time: 11/22/21 12:01 AM   Specimen: Nasopharyngeal(NP) swabs in vial transport medium  Result Value Ref Range Status   SARS Coronavirus 2 by RT PCR NEGATIVE NEGATIVE Final    Comment: (NOTE) SARS-CoV-2 target nucleic acids are NOT DETECTED.  The SARS-CoV-2 RNA is generally detectable in upper respiratory specimens during the acute phase of infection. The lowest concentration  of SARS-CoV-2 viral copies this assay can detect is 138 copies/mL. A negative result does not preclude SARS-Cov-2 infection and should not be used as the sole basis for treatment or other patient management decisions. A negative result may occur with  improper specimen collection/handling, submission of specimen other than nasopharyngeal swab, presence of viral mutation(s) within the areas targeted by this assay, and inadequate number of viral copies(<138 copies/mL). A negative result must be combined with clinical observations, patient history, and epidemiological information. The expected result is Negative.  Fact Sheet for Patients:  EntrepreneurPulse.com.au  Fact Sheet for Healthcare Providers:  IncredibleEmployment.be  This test is no t yet approved or cleared by the [place] FDA and  has been authorized for detection and/or diagnosis of SARS-CoV-2 by FDA under an Emergency Use Authorization (EUA). This EUA will remain  in effect (meaning this test can be used) for the duration of the COVID-19 declaration under Section 564(b)(1) of the Act, 21 U.S.C.section 360bbb-3(b)(1), unless the authorization is terminated  or revoked sooner.       Influenza A by PCR NEGATIVE NEGATIVE Final   Influenza B by PCR NEGATIVE NEGATIVE Final    Comment: (NOTE) The Xpert Xpress SARS-CoV-2/FLU/RSV plus assay is intended as an aid in the diagnosis of influenza from Nasopharyngeal swab specimens and should not be used as a sole basis for treatment. Nasal washings and aspirates are unacceptable for Xpert Xpress SARS-CoV-2/FLU/RSV testing.  Fact Sheet for Patients: EntrepreneurPulse.com.au  Fact Sheet for Healthcare Providers: IncredibleEmployment.be  This test is not yet approved or cleared by the [place] and  has been authorized for detection and/or diagnosis of SARS-CoV-2 by FDA under an Emergency Use  Authorization (EUA). This EUA will remain in effect (meaning this test can be used) for the duration of the COVID-19 declaration under Section 564(b)(1) of the Act, 21 U.S.C. section 360bbb-3(b)(1), unless the authorization is terminated or revoked.  Performed at [facility], [address] [place], [place] [address]   Gastrointestinal Panel by PCR , Stool     Status: None   Collection Time: 11/22/21  2:07 PM   Specimen: Stool  Result Value Ref Range Status   Campylobacter species NOT DETECTED NOT DETECTED Final   Plesimonas shigelloides NOT DETECTED NOT DETECTED Final   Salmonella species NOT DETECTED NOT DETECTED Final   Yersinia enterocolitica NOT DETECTED NOT DETECTED Final   Vibrio species NOT DETECTED NOT DETECTED Final   Vibrio cholerae NOT DETECTED NOT DETECTED Final   Enteroaggregative E coli (EAEC) NOT DETECTED NOT DETECTED Final   Enteropathogenic E coli (EPEC) NOT DETECTED NOT DETECTED Final   Enterotoxigenic E coli (ETEC) NOT DETECTED NOT DETECTED Final   Shiga like toxin producing E coli (STEC) NOT DETECTED NOT DETECTED Final   Shigella/Enteroinvasive E coli (EIEC) NOT DETECTED NOT DETECTED Final   Cryptosporidium NOT DETECTED NOT DETECTED Final   Cyclospora cayetanensis NOT DETECTED NOT DETECTED Final   Entamoeba histolytica NOT DETECTED NOT DETECTED Final   Giardia lamblia NOT DETECTED NOT DETECTED Final   Adenovirus F40/41 NOT DETECTED NOT DETECTED Final   Astrovirus NOT DETECTED NOT DETECTED Final   Norovirus GI/GII NOT DETECTED NOT DETECTED Final   Rotavirus A NOT DETECTED NOT DETECTED Final   Sapovirus (I, II, IV, and V) NOT DETECTED NOT DETECTED Final    Comment: Performed at [facility], [address] [place], [place] [address]     Labs: CBC: Recent Labs  Lab 11/21/21 1836 11/22/21 0627  WBC 7.4 7.5  HGB 16.1 14.7  HCT 43.0 RESULTS UNAVAILABLE DUE TO INTERFERING SUBSTANCE  MCV 91.9 RESULTS UNAVAILABLE DUE TO INTERFERING  SUBSTANCE  PLT 164 136*   Basic Metabolic Panel: Recent Labs  Lab 11/21/21 1836 11/22/21 0627  NA 136 136  K 4.5 4.3  CL 106 107  CO2 25 24  GLUCOSE 104* 98  BUN 11 8  CREATININE 0.76 0.76  CALCIUM 9.1 8.0*   Liver Function Tests: Recent Labs  Lab 11/21/21 1836  AST 18  ALT 13  ALKPHOS 54  BILITOT 0.8  PROT 5.9*  ALBUMIN 3.5   Recent Labs  Lab 11/21/21 1836  LIPASE 48   No results for input(s): AMMONIA in the last 168 hours. Cardiac Enzymes: No results for input(s): CKTOTAL, CKMB, CKMBINDEX, TROPONINI in the last 168 hours. BNP (last 3 results) No results for input(s): BNP in the last 8760 hours. CBG: No results for input(s): GLUCAP in the last 168 hours.   Signed:  Suann Larry Martasia Talamante  [facility] 11/22/2021, 7:27 PM

## 2021-11-22 NOTE — Progress Notes (Signed)
Was requested to talk with patient, as it was suspected he was smoking in room, as evidenced by staff smelling smoke in room and finding lighter and cigarettes in bed. Entered room introduced self. Told patient had been informed by ED staff he was smoking in room. Explained he had to stop smoking if he was doing so, and he denied it. Explained smoking in the hospital was against policy and was extremely hazardous to patient, as he could potentially burn himself, or cause a fire hazard to other people in the hospital. He then stated "I was told if I did it again there was the possibility I could be discharged. I confirmed as it was related to noncompliance with essential safety policies of the hospital, potentially jeopardizing himself, others and staff.

## 2021-11-22 NOTE — Progress Notes (Signed)
°  Progress Note   Patient: Sean Gallegos ZRA:[id number] DOB: 1985-11-28 DOA: 11/21/2021     0 DOS: the patient was seen and examined on 11/22/2021   Brief hospital course: Mr. Flanigan is a 36 y.o. M with hx Bipolar disorder who presented with abdominal pain, vomiting and fever as well as melenic and frankly bloody bowel movements for 1 day.  In the ER, CT showed terminal ileitis.  GI were consulted. Patient started on steroids for presumed Crohn's flare.  Assessment and Plan * Crohn's disease of colon, with rectal bleeding ([facility])- (present on admission)    Bipolar affective disorder ([facility])- (present on admission)      This is a no charge note. For furhter details, please see H&P by Dr. Arville Care from earlier today.  Author: Alberteen Sam, MD 11/22/2021 12:41 PM  For on call review [email or web address].

## 2021-11-23 SURGERY — COLONOSCOPY WITH PROPOFOL
Anesthesia: General

## 2021-11-24 LAB — CALPROTECTIN, FECAL: Calprotectin, Fecal: 272 ug/g — ABNORMAL HIGH (ref 0–120)

## 2023-12-20 IMAGING — CT CT ABD-PELV W/ CM
2 of 4 series · 15 of 46 positions shown, 17 images · IV contrast (APPLIED)
Comparison: CT abdomen pelvis 07/21/2021, CT abdomen pelvis
09/01/2020

CLINICAL DATA: Abdominal pain, acute, nonlocalized. states that he
is having complications from his Crohn's disease- pt states that he
is having dizziness, abd pain, dark urine, and dark stool - pt also
states he had a fever of 100 and vomiting on [REDACTED]

EXAM:
CT ABDOMEN AND PELVIS WITH CONTRAST
TECHNIQUE: Multidetector CT imaging of the abdomen and pelvis was performed
using the standard protocol following bolus administration of
intravenous contrast.

[Series 2: routine abd/pel with · axial · 0.82mm/px · z∈[-1020,-600]mm · 12 of 92 slices shown, 14 images]
[im 4/92  soft-tissue]
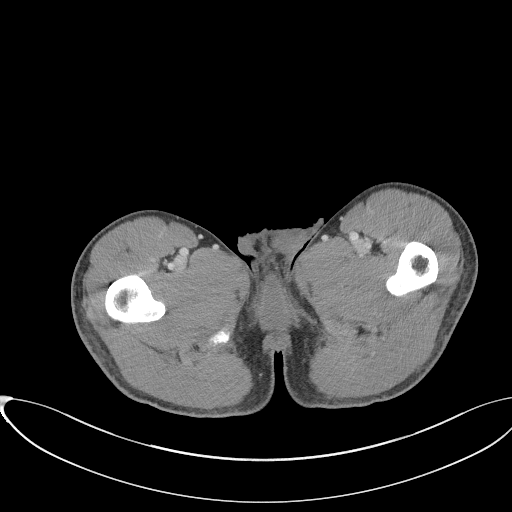
[im 4/92  bone]
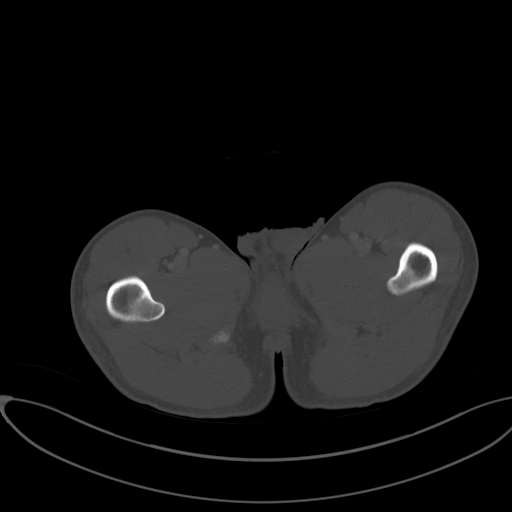
[im 12/92  soft-tissue]
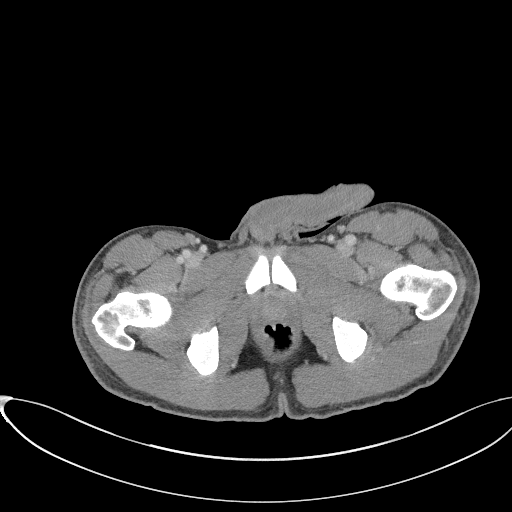
[im 19/92  soft-tissue]
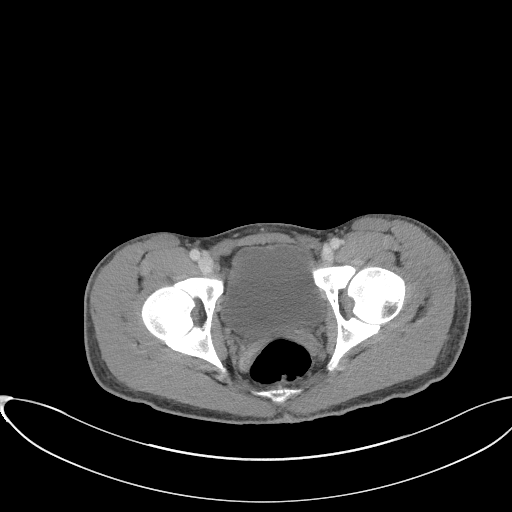
[im 27/92  soft-tissue]
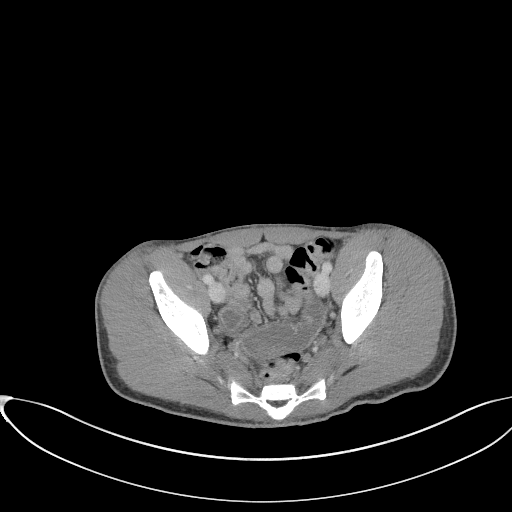
[im 35/92  soft-tissue]
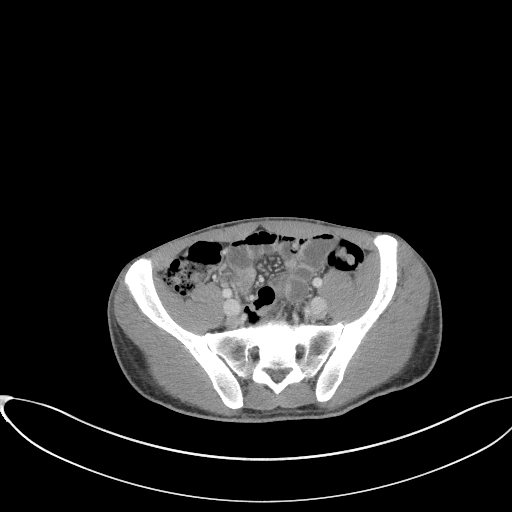
[im 42/92  soft-tissue]
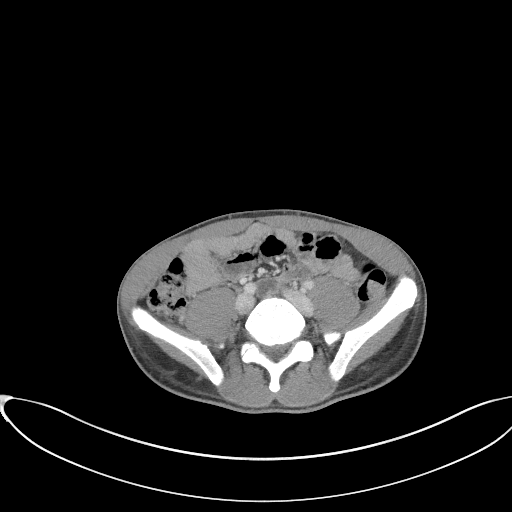
[im 50/92  soft-tissue]
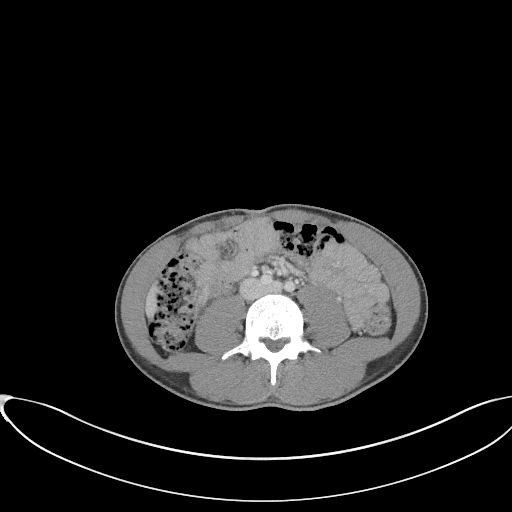
[im 57/92  soft-tissue]
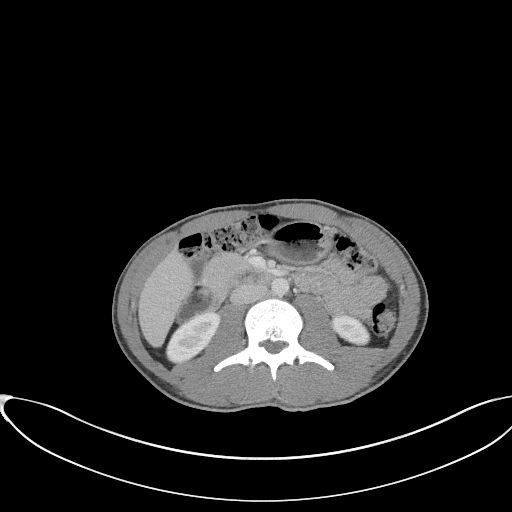
[im 65/92  soft-tissue]
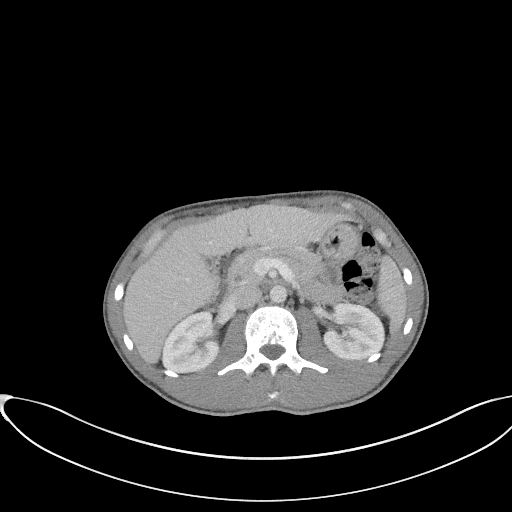
[im 65/92  bone]
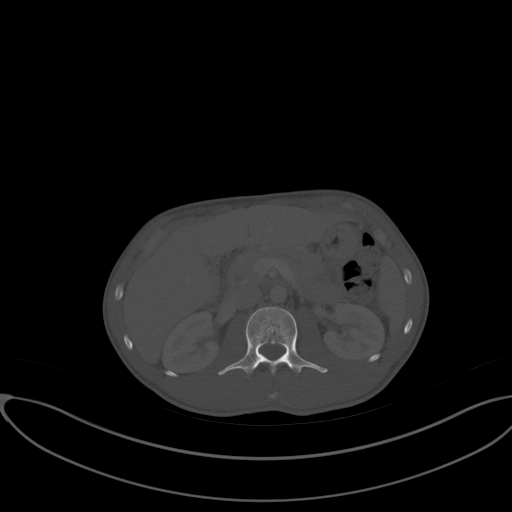
[im 73/92  soft-tissue]
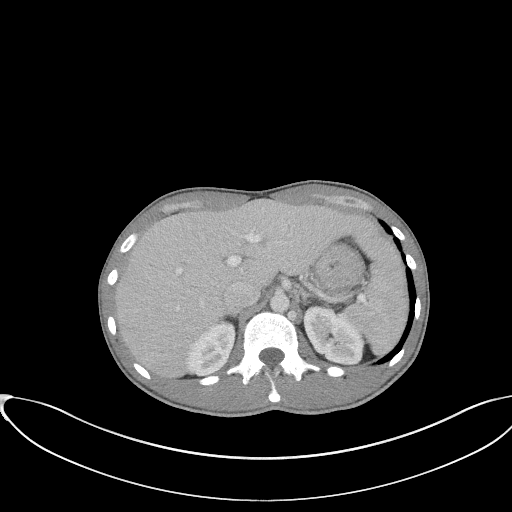
[im 80/92  soft-tissue]
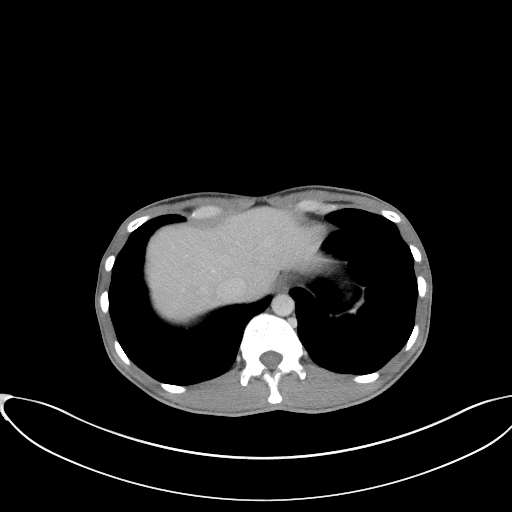
[im 88/92  soft-tissue]
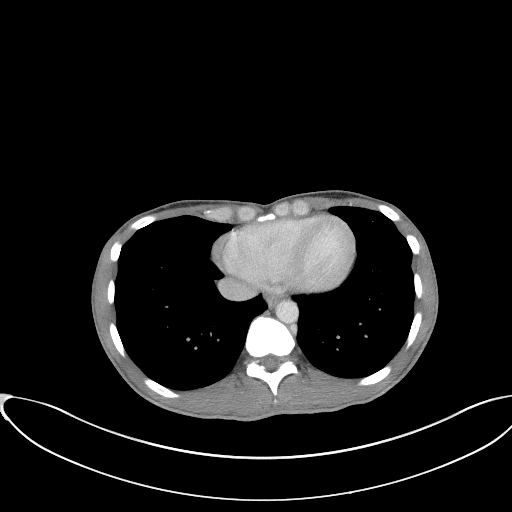

[Series 5: coronal st · coronal · 0.66mm/px · 3 of 67 slices shown]
[im 23/67  soft-tissue]
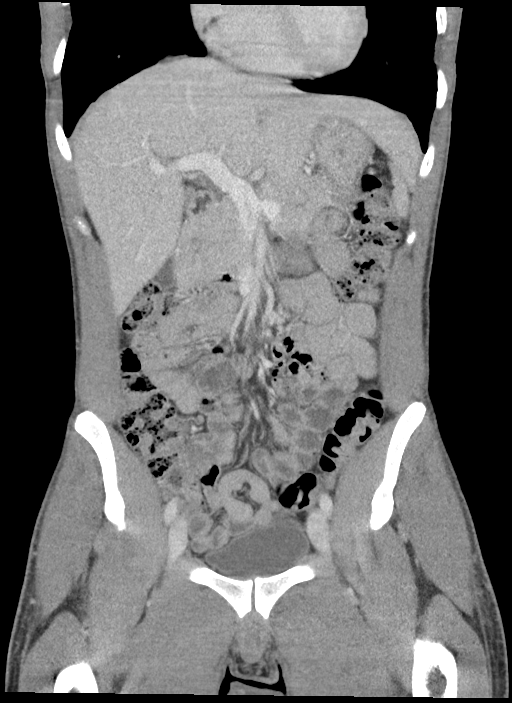
[im 30/67  soft-tissue]
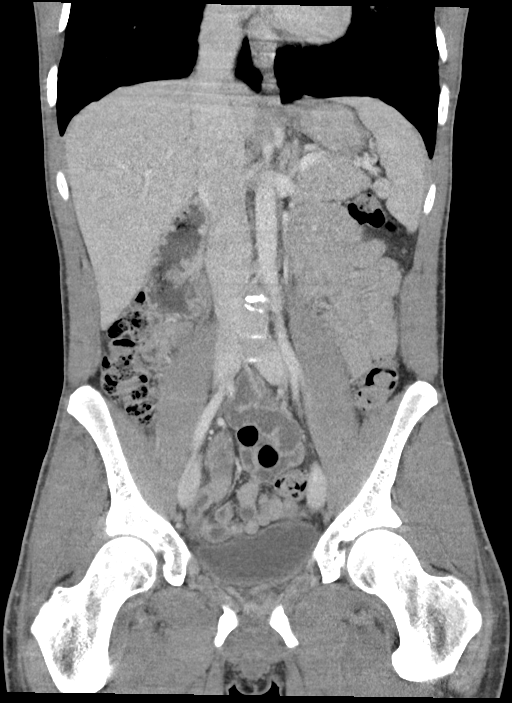
[im 37/67  soft-tissue]
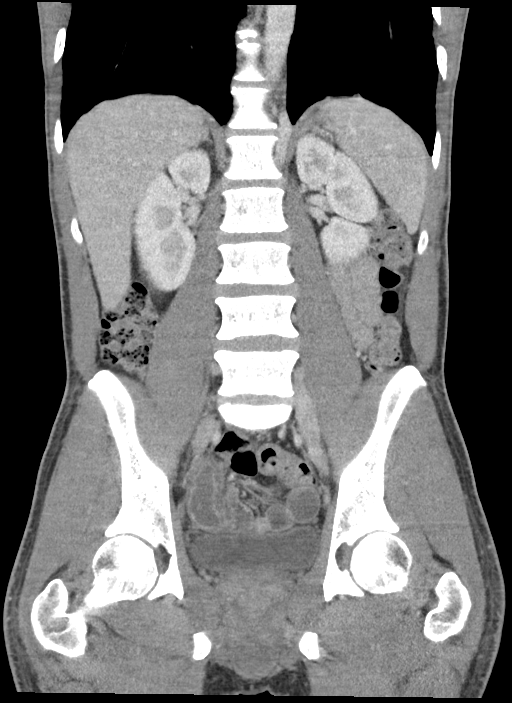

[15 of 46 positions shown; findings below may reference images not displayed]

RADIATION DOSE REDUCTION: This exam was performed according to the
departmental dose-optimization program which includes automated
exposure control, adjustment of the mA and/or kV according to
patient size and/or use of iterative reconstruction technique.

CONTRAST:  80mL OMNIPAQUE IOHEXOL 300 MG/ML  SOLN
FINDINGS: Lower chest: No acute abnormality.

Hepatobiliary: No focal liver abnormality. No gallstones,
gallbladder wall thickening, or pericholecystic fluid. No biliary
dilatation.

Pancreas: No focal lesion. Normal pancreatic contour. No surrounding
inflammatory changes. No main pancreatic ductal dilatation. CT
Normal in size without focal abnormality.

Spleen: Normal in size without focal abnormality.

Adrenals/Urinary Tract:

No adrenal nodule bilaterally.

Bilateral kidneys enhance symmetrically.

No hydronephrosis. No hydroureter.

The urinary bladder is unremarkable.

Stomach/Bowel: Stomach is within normal limits. Bowel wall
thickening and mucosal hyperemia of the terminal ileum ([DATE]). No
evidence of large bowel wall thickening or dilatation. Query
partially visualized normal appendix.

Vascular/Lymphatic: No abdominal aorta or iliac aneurysm. No
abdominal, pelvic, or inguinal lymphadenopathy.

Reproductive: Prostate is unremarkable.

Other: No intraperitoneal free fluid. No intraperitoneal free gas.
No organized fluid collection.

Musculoskeletal:

No abdominal wall hernia or abnormality.

No suspicious lytic or blastic osseous lesions. No acute displaced
fracture. Multilevel degenerative changes of the spine.
IMPRESSION: Terminal ileitis. Differential diagnosis of etiology includes
inflammation (such as Crohn's), infection, less likely ischemia.
